# Patient Record
Sex: Female | Born: 1985 | Race: Black or African American | Hispanic: No | Marital: Married | Smoking: Former smoker
Health system: Southern US, Community
[De-identification: ages and names within clinical notes are randomized; demographics above are authoritative.]

## PROBLEM LIST (undated history)

## (undated) ENCOUNTER — Inpatient Hospital Stay (HOSPITAL_COMMUNITY): Payer: Self-pay

## (undated) DIAGNOSIS — L089 Local infection of the skin and subcutaneous tissue, unspecified: Secondary | ICD-10-CM

## (undated) HISTORY — PX: DILATION AND CURETTAGE OF UTERUS: SHX78

## (undated) HISTORY — PX: ECTOPIC PREGNANCY SURGERY: SHX613

## (undated) SURGERY — Surgical Case
Anesthesia: *Unknown

---
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
## 2012-04-03 ENCOUNTER — Ambulatory Visit (HOSPITAL_COMMUNITY)
Admission: EM | Admit: 2012-04-03 | Discharge: 2012-04-04 | Disposition: A | Discharge status: Home / Self Care | Payer: BC Managed Care – PPO | Attending: Emergency Medicine | Admitting: Emergency Medicine

## 2012-04-03 ENCOUNTER — Encounter (HOSPITAL_COMMUNITY): Payer: Self-pay

## 2012-04-03 ENCOUNTER — Emergency Department (HOSPITAL_COMMUNITY): Payer: BC Managed Care – PPO | Admitting: Anesthesiology

## 2012-04-03 ENCOUNTER — Inpatient Hospital Stay: Admit: 2012-04-03 | Payer: Self-pay | Admitting: Orthopedic Surgery

## 2012-04-03 ENCOUNTER — Encounter (HOSPITAL_COMMUNITY)
Admission: EM | Disposition: A | Discharge status: Home / Self Care | Payer: Self-pay | Source: Home / Self Care | Attending: Emergency Medicine

## 2012-04-03 ENCOUNTER — Encounter (HOSPITAL_COMMUNITY): Payer: Self-pay | Admitting: Anesthesiology

## 2012-04-03 DIAGNOSIS — M65839 Other synovitis and tenosynovitis, unspecified forearm: Secondary | ICD-10-CM | POA: Insufficient documentation

## 2012-04-03 DIAGNOSIS — L089 Local infection of the skin and subcutaneous tissue, unspecified: Secondary | ICD-10-CM

## 2012-04-03 DIAGNOSIS — L02519 Cutaneous abscess of unspecified hand: Secondary | ICD-10-CM | POA: Insufficient documentation

## 2012-04-03 DIAGNOSIS — Z792 Long term (current) use of antibiotics: Secondary | ICD-10-CM | POA: Insufficient documentation

## 2012-04-03 DIAGNOSIS — L03019 Cellulitis of unspecified finger: Secondary | ICD-10-CM | POA: Insufficient documentation

## 2012-04-03 DIAGNOSIS — M65849 Other synovitis and tenosynovitis, unspecified hand: Secondary | ICD-10-CM | POA: Insufficient documentation

## 2012-04-03 HISTORY — PX: I & D EXTREMITY: SHX5045

## 2012-04-03 HISTORY — DX: Local infection of the skin and subcutaneous tissue, unspecified: L08.9

## 2012-04-03 LAB — CBC WITH DIFFERENTIAL/PLATELET
Basophils Absolute: 0 10*3/uL (ref 0.0–0.1)
Basophils Absolute: 0 10*3/uL (ref 0.0–0.1)
Basophils Relative: 0 % (ref 0–1)
Basophils Relative: 0 % (ref 0–1)
Eosinophils Absolute: 0.1 10*3/uL (ref 0.0–0.7)
Eosinophils Relative: 1 % (ref 0–5)
HCT: 35.7 % — ABNORMAL LOW (ref 36.0–46.0)
Hemoglobin: 12.2 g/dL (ref 12.0–15.0)
Lymphocytes Relative: 20 % (ref 12–46)
Lymphs Abs: 2.4 10*3/uL (ref 0.7–4.0)
MCH: 32 pg (ref 26.0–34.0)
MCHC: 33.4 g/dL (ref 30.0–36.0)
MCHC: 34.2 g/dL (ref 30.0–36.0)
MCV: 93.7 fL (ref 78.0–100.0)
Monocytes Absolute: 0.4 10*3/uL (ref 0.1–1.0)
Monocytes Absolute: 0.4 10*3/uL (ref 0.1–1.0)
Monocytes Relative: 3 % (ref 3–12)
Neutro Abs: 10.1 10*3/uL — ABNORMAL HIGH (ref 1.7–7.7)
Neutro Abs: 8.9 10*3/uL — ABNORMAL HIGH (ref 1.7–7.7)
Neutrophils Relative %: 78 % — ABNORMAL HIGH (ref 43–77)
Neutrophils Relative %: 76 % (ref 43–77)
Platelets: 224 10*3/uL (ref 150–400)
Platelets: 216 10*3/uL (ref 150–400)
RBC: 3.81 MIL/uL — ABNORMAL LOW (ref 3.87–5.11)
RDW: 15.5 % (ref 11.5–15.5)
RDW: 15.2 % (ref 11.5–15.5)
WBC: 13 10*3/uL — ABNORMAL HIGH (ref 4.0–10.5)
WBC: 11.8 10*3/uL — ABNORMAL HIGH (ref 4.0–10.5)

## 2012-04-03 LAB — GRAM STAIN

## 2012-04-03 SURGERY — IRRIGATION AND DEBRIDEMENT EXTREMITY
Anesthesia: General | Site: Thumb | Laterality: Left | Wound class: Dirty or Infected

## 2012-04-03 MED ORDER — HYDROMORPHONE HCL PF 1 MG/ML IJ SOLN
0.5000 mg | INTRAMUSCULAR | Status: DC | PRN
  Administered 2012-04-03 – 2012-04-04 (×5): 1 mg via INTRAVENOUS
  Filled 2012-04-03 (×5): qty 1

## 2012-04-03 MED ORDER — ONDANSETRON HCL 4 MG/2ML IJ SOLN: 4.0000 mg | Freq: Once | INTRAMUSCULAR | Status: DC | PRN

## 2012-04-03 MED ORDER — ONDANSETRON HCL 4 MG/2ML IJ SOLN: 4.0000 mg | Freq: Four times a day (QID) | INTRAMUSCULAR | Status: DC | PRN

## 2012-04-03 MED ORDER — ENOXAPARIN SODIUM 30 MG/0.3ML ~~LOC~~ SOLN: 30.0000 mg | Freq: Two times a day (BID) | SUBCUTANEOUS | Status: DC

## 2012-04-03 MED ORDER — FENTANYL CITRATE 0.05 MG/ML IJ SOLN
INTRAMUSCULAR | Status: DC | PRN
  Administered 2012-04-03: 100 ug via INTRAVENOUS
  Administered 2012-04-03: 50 ug via INTRAVENOUS
  Administered 2012-04-03: 100 ug via INTRAVENOUS

## 2012-04-03 MED ORDER — PROPOFOL 10 MG/ML IV EMUL
INTRAVENOUS | Status: DC | PRN
  Administered 2012-04-03: 100 mg via INTRAVENOUS

## 2012-04-03 MED ORDER — OXYCODONE-ACETAMINOPHEN 5-325 MG PO TABS
1.0000 | ORAL_TABLET | ORAL | Status: DC | PRN
  Administered 2012-04-03 – 2012-04-04 (×3): 2 via ORAL
  Filled 2012-04-03 (×3): qty 2

## 2012-04-03 MED ORDER — VANCOMYCIN HCL IN DEXTROSE 1-5 GM/200ML-% IV SOLN
1000.0000 mg | Freq: Two times a day (BID) | INTRAVENOUS | Status: DC
  Administered 2012-04-04: 1000 mg via INTRAVENOUS
  Filled 2012-04-03 (×3): qty 200

## 2012-04-03 MED ORDER — DEXTROSE-NACL 5-0.45 % IV SOLN: INTRAVENOUS | Status: DC

## 2012-04-03 MED ORDER — ENOXAPARIN SODIUM 30 MG/0.3ML ~~LOC~~ SOLN
30.0000 mg | Freq: Two times a day (BID) | SUBCUTANEOUS | Status: DC
  Administered 2012-04-04: 30 mg via SUBCUTANEOUS
  Filled 2012-04-03 (×3): qty 0.3

## 2012-04-03 MED ORDER — BUPIVACAINE HCL (PF) 0.25 % IJ SOLN
INTRAMUSCULAR | Status: DC | PRN
  Administered 2012-04-03: 5 mL

## 2012-04-03 MED ORDER — HYDROMORPHONE HCL PF 1 MG/ML IJ SOLN: 0.5000 mg | INTRAMUSCULAR | Status: DC | PRN

## 2012-04-03 MED ORDER — METOCLOPRAMIDE HCL 10 MG PO TABS: 5.0000 mg | ORAL_TABLET | Freq: Three times a day (TID) | ORAL | Status: DC | PRN

## 2012-04-03 MED ORDER — HYDROMORPHONE HCL PF 1 MG/ML IJ SOLN
INTRAMUSCULAR | Status: AC
  Administered 2012-04-03: 0.5 mg via INTRAVENOUS
  Filled 2012-04-03: qty 1

## 2012-04-03 MED ORDER — VANCOMYCIN HCL 1000 MG IV SOLR
1000.0000 mg | INTRAVENOUS | Status: DC | PRN
  Administered 2012-04-03: 1000 mg via INTRAVENOUS

## 2012-04-03 MED ORDER — LACTATED RINGERS IV SOLN
INTRAVENOUS | Status: DC | PRN
  Administered 2012-04-03: 19:00:00 via INTRAVENOUS

## 2012-04-03 MED ORDER — SUCCINYLCHOLINE CHLORIDE 20 MG/ML IJ SOLN
INTRAMUSCULAR | Status: DC | PRN
  Administered 2012-04-03: 120 mg via INTRAVENOUS

## 2012-04-03 MED ORDER — METOCLOPRAMIDE HCL 5 MG/ML IJ SOLN: 5.0000 mg | Freq: Three times a day (TID) | INTRAMUSCULAR | Status: DC | PRN

## 2012-04-03 MED ORDER — VANCOMYCIN HCL IN DEXTROSE 1-5 GM/200ML-% IV SOLN
INTRAVENOUS | Status: AC
  Filled 2012-04-03: qty 200

## 2012-04-03 MED ORDER — DEXTROSE-NACL 5-0.45 % IV SOLN
INTRAVENOUS | Status: DC
  Administered 2012-04-04: 01:00:00 via INTRAVENOUS

## 2012-04-03 MED ORDER — MIDAZOLAM HCL 5 MG/5ML IJ SOLN
INTRAMUSCULAR | Status: DC | PRN
  Administered 2012-04-03: 2 mg via INTRAVENOUS

## 2012-04-03 MED ORDER — ONDANSETRON HCL 4 MG PO TABS: 4.0000 mg | ORAL_TABLET | Freq: Four times a day (QID) | ORAL | Status: DC | PRN

## 2012-04-03 MED ORDER — OXYCODONE-ACETAMINOPHEN 5-325 MG PO TABS: 1.0000 | ORAL_TABLET | ORAL | Status: DC | PRN

## 2012-04-03 MED ORDER — ONDANSETRON HCL 4 MG/2ML IJ SOLN
INTRAMUSCULAR | Status: DC | PRN
  Administered 2012-04-03: 4 mg via INTRAVENOUS

## 2012-04-03 MED ORDER — LIDOCAINE HCL (CARDIAC) 20 MG/ML IV SOLN
INTRAVENOUS | Status: DC | PRN
  Administered 2012-04-03: 50 mg via INTRAVENOUS

## 2012-04-03 MED ORDER — SODIUM CHLORIDE 0.9 % IR SOLN
Status: DC | PRN
  Administered 2012-04-03: 1000 mL

## 2012-04-03 MED ORDER — HYDROMORPHONE HCL PF 1 MG/ML IJ SOLN
0.2500 mg | INTRAMUSCULAR | Status: DC | PRN
  Administered 2012-04-03 (×4): 0.5 mg via INTRAVENOUS

## 2012-04-03 MED ORDER — BUPIVACAINE HCL (PF) 0.25 % IJ SOLN
INTRAMUSCULAR | Status: AC
  Filled 2012-04-03: qty 30

## 2012-04-03 SURGICAL SUPPLY — 51 items
BANDAGE CONFORM 2  STR LF (GAUZE/BANDAGES/DRESSINGS) IMPLANT
BANDAGE ELASTIC 3 VELCRO ST LF (GAUZE/BANDAGES/DRESSINGS) ×1 IMPLANT
BANDAGE ELASTIC 4 VELCRO ST LF (GAUZE/BANDAGES/DRESSINGS) ×2 IMPLANT
BANDAGE GAUZE ELAST BULKY 4 IN (GAUZE/BANDAGES/DRESSINGS) ×3 IMPLANT
BNDG CMPR 9X4 STRL LF SNTH (GAUZE/BANDAGES/DRESSINGS)
BNDG ESMARK 4X9 LF (GAUZE/BANDAGES/DRESSINGS) IMPLANT
CLOTH BEACON ORANGE TIMEOUT ST (SAFETY) ×2 IMPLANT
CORDS BIPOLAR (ELECTRODE) IMPLANT
COVER SURGICAL LIGHT HANDLE (MISCELLANEOUS) ×3 IMPLANT
CUFF TOURNIQUET SINGLE 18IN (TOURNIQUET CUFF) ×2 IMPLANT
DECANTER SPIKE VIAL GLASS SM (MISCELLANEOUS) ×1 IMPLANT
DRAIN PENROSE 1/4X12 LTX STRL (WOUND CARE) IMPLANT
DRAPE OEC MINIVIEW 54X84 (DRAPES) IMPLANT
DRAPE SURG 17X23 STRL (DRAPES) ×2 IMPLANT
DRSG PAD ABDOMINAL 8X10 ST (GAUZE/BANDAGES/DRESSINGS) ×2 IMPLANT
DURAPREP 26ML APPLICATOR (WOUND CARE) ×1 IMPLANT
ELECT REM PT RETURN 9FT ADLT (ELECTROSURGICAL) ×2
ELECTRODE REM PT RTRN 9FT ADLT (ELECTROSURGICAL) IMPLANT
GAUZE PACKING IODOFORM 1/4X5 (PACKING) ×1 IMPLANT
GAUZE XEROFORM 1X8 LF (GAUZE/BANDAGES/DRESSINGS) ×1 IMPLANT
GLOVE BIO SURGEON STRL SZ8.5 (GLOVE) ×2 IMPLANT
GOWN SRG XL XLNG 56XLVL 4 (GOWN DISPOSABLE) ×1 IMPLANT
GOWN STRL NON-REIN LRG LVL3 (GOWN DISPOSABLE) ×2 IMPLANT
GOWN STRL NON-REIN XL XLG LVL4 (GOWN DISPOSABLE) ×2
HANDPIECE INTERPULSE COAX TIP (DISPOSABLE) ×2
KIT BASIN OR (CUSTOM PROCEDURE TRAY) ×2 IMPLANT
KIT ROOM TURNOVER OR (KITS) ×2 IMPLANT
MANIFOLD NEPTUNE II (INSTRUMENTS) ×2 IMPLANT
NDL HYPO 25GX1X1/2 BEV (NEEDLE) IMPLANT
NDL HYPO 25X1 1.5 SAFETY (NEEDLE) ×1 IMPLANT
NEEDLE HYPO 25GX1X1/2 BEV (NEEDLE) IMPLANT
NEEDLE HYPO 25X1 1.5 SAFETY (NEEDLE) ×2 IMPLANT
NS IRRIG 1000ML POUR BTL (IV SOLUTION) ×2 IMPLANT
PACK ORTHO EXTREMITY (CUSTOM PROCEDURE TRAY) ×2 IMPLANT
PAD ARMBOARD 7.5X6 YLW CONV (MISCELLANEOUS) ×4 IMPLANT
PAD CAST 4YDX4 CTTN HI CHSV (CAST SUPPLIES) ×2 IMPLANT
PADDING CAST COTTON 4X4 STRL (CAST SUPPLIES) ×2
SET HNDPC FAN SPRY TIP SCT (DISPOSABLE) IMPLANT
SPONGE GAUZE 4X4 12PLY (GAUZE/BANDAGES/DRESSINGS) ×3 IMPLANT
SPONGE LAP 18X18 X RAY DECT (DISPOSABLE) ×1 IMPLANT
SUCTION FRAZIER TIP 10 FR DISP (SUCTIONS) ×2 IMPLANT
SUT VICRYL RAPIDE 4/0 PS 2 (SUTURE) IMPLANT
SYR 20CC LL (SYRINGE) IMPLANT
SYR CONTROL 10ML LL (SYRINGE) ×2 IMPLANT
TOWEL OR 17X24 6PK STRL BLUE (TOWEL DISPOSABLE) ×2 IMPLANT
TOWEL OR 17X26 10 PK STRL BLUE (TOWEL DISPOSABLE) ×2 IMPLANT
TUBE ANAEROBIC SPECIMEN COL (MISCELLANEOUS) ×2 IMPLANT
TUBE CONNECTING 12X1/4 (SUCTIONS) ×2 IMPLANT
UNDERPAD 30X30 INCONTINENT (UNDERPADS AND DIAPERS) ×2 IMPLANT
WATER STERILE IRR 1000ML POUR (IV SOLUTION) ×1 IMPLANT
YANKAUER SUCT BULB TIP NO VENT (SUCTIONS) ×2 IMPLANT

## 2012-04-03 NOTE — Preoperative (Signed)
Beta Blockers   Reason not to administer Beta Blockers:Not Applicable 

## 2012-04-03 NOTE — Anesthesia Postprocedure Evaluation (Signed)
Anesthesia Post Note  Patient: Allison Pratt  Procedure(s) Performed: Procedure(s) (LRB): IRRIGATION AND DEBRIDEMENT EXTREMITY (Left)  Anesthesia type: general  Patient location: PACU  Post pain: Pain level controlled  Post assessment: Patient's Cardiovascular Status Stable  Last Vitals:  Filed Vitals:   04/03/12 2007  BP:   Pulse: 93  Temp:   Resp: 23    Post vital signs: Reviewed and stable  Level of consciousness: sedated  Complications: No apparent anesthesia complications

## 2012-04-03 NOTE — Transfer of Care (Signed)
Immediate Anesthesia Transfer of Care Note  Patient: Allison Pratt  Procedure(s) Performed: Procedure(s) (LRB): IRRIGATION AND DEBRIDEMENT EXTREMITY (Left)  Patient Location: PACU  Anesthesia Type: General  Level of Consciousness: awake, alert  and oriented  Airway & Oxygen Therapy: Patient Spontanous Breathing and Patient connected to nasal cannula oxygen  Post-op Assessment: Report given to PACU RN and Post -op Vital signs reviewed and stable  Post vital signs: Reviewed and stable  Complications: No apparent anesthesia complications

## 2012-04-03 NOTE — ED Notes (Signed)
Pt. To have hand surgery, Dr. Rudell Cobb at the bedside.  Pt. Being prepared for surgery.

## 2012-04-03 NOTE — Progress Notes (Signed)
ANTIBIOTIC CONSULT NOTE - INITIAL  Pharmacy Consult for vancomycin Indication: left hand cellulitis  No Known Allergies  Patient Measurements: Height: 5\' 4"  (162.6 cm) Weight: 140 lb (63.504 kg) IBW/kg (Calculated) : 54.7    Vital Signs: Temp: 97.8 F (36.6 C) (08/02 2130) Temp src: Oral (08/02 1834) BP: 126/71 mmHg (08/02 2130) Pulse Rate: 68  (08/02 2115) Intake/Output from previous day:   Intake/Output from this shift: Total I/O In: 500 [I.V.:500] Out: 25 [Blood:25]  Labs:  [facility] 04/03/12 2120 04/03/12 1822  WBC 11.8* 13.0*  HGB 12.2 11.9*  PLT 216 224  LABCREA -- --  CREATININE -- --   CrCl is unknown because no creatinine reading has been taken. No results found for this basename: VANCOTROUGH:2,VANCOPEAK:2,VANCORANDOM:2,GENTTROUGH:2,GENTPEAK:2,GENTRANDOM:2,TOBRATROUGH:2,TOBRAPEAK:2,TOBRARND:2,AMIKACINPEAK:2,AMIKACINTROU:2,AMIKACIN:2, in the last 72 hours   Microbiology: Recent Results (from the past 720 hour(s))  GRAM STAIN     Status: Normal   Collection Time   04/03/12  7:37 PM      Component Value Range Status Comment   Specimen Description WOUND LEFT THUMB   Final    Special Requests PATIENT ON FOLLOWING VANCOMYCIN   Final    Gram Stain     Final    Value: ABUNDANT WBC PRESENT, PREDOMINANTLY PMN     FEW GRAM POSITIVE COCCI IN CLUSTERS   Report Status 04/03/2012 FINAL   Final     Medical History: History reviewed. No pertinent past medical history.  Medications:  Prescriptions prior to admission  Medication Sig Dispense Refill  . cephALEXin (KEFLEX) 500 MG capsule Take 500 mg by mouth 3 (three) times daily. X 7 days. Started 03/29/12.      Marland Kitchen Hydrocodone-Acetaminophen 10-300 MG TABS Take 1 tablet by mouth every 6 (six) hours as needed. For pain      . naproxen (NAPROSYN) 500 MG tablet Take 500 mg by mouth 2 (two) times daily as needed. For pain      . sulfamethoxazole-trimethoprim (BACTRIM DS) 800-160 MG per tablet Take 1 tablet by mouth 2 (two)  times daily. X 10 days. Started on 03/30/12.       Assessment: 46 YOF with no significant PMH presented with left thumb infection, s/p I&D, wound culture is pending. Pharmacy is consulted to start vancomycin empirically to cover MRSA. Vancomycin 1g was given in OR at 1925. No baseline scr available, no hx of renal impairment. Pt. Is afebrile, wbc slightly elevated, was on bactrim and keflex PTA since 7/29  Goal of Therapy:  Vancomycin trough level 10-15 mcg/ml  Plan:  - Continue vancomycin 1g IV Q12hrs as ordered by Dr. Mina Marble start at 0600 - BMET in AM, adjust vancomycin dose base on renal function if needed. - f/u cultures.   Bayard Hugger, PharmD, BCPS  Clinical Pharmacist  Pager: [phone]  04/03/2012,10:26 PM

## 2012-04-03 NOTE — Op Note (Signed)
See dictated note [id number]

## 2012-04-03 NOTE — Anesthesia Preprocedure Evaluation (Addendum)
Anesthesia Evaluation  Patient identified by MRN, date of birth, ID band Patient awake    Reviewed: Allergy & Precautions, H&P , NPO status , Patient's Chart, lab work & pertinent test results  Airway Mallampati: I TM Distance: >3 FB Neck ROM: Full    Dental  (+) Teeth Intact and Dental Advisory Given   Pulmonary neg pulmonary ROS,          Cardiovascular negative cardio ROS      Neuro/Psych negative neurological ROS  negative psych ROS   GI/Hepatic negative GI ROS, Neg liver ROS,   Endo/Other  negative endocrine ROS  Renal/GU negative Renal ROS     Musculoskeletal negative musculoskeletal ROS (+)   Abdominal   Peds  Hematology negative hematology ROS (+)   Anesthesia Other Findings   Reproductive/Obstetrics negative OB ROS                         Anesthesia Physical Anesthesia Plan  ASA: II and Emergent  Anesthesia Plan: General   Post-op Pain Management:    Induction: Intravenous, Rapid sequence and Cricoid pressure planned  Airway Management Planned: Oral ETT  Additional Equipment:   Intra-op Plan:   Post-operative Plan: Extubation in OR  Informed Consent: I have reviewed the patients History and Physical, chart, labs and discussed the procedure including the risks, benefits and alternatives for the proposed anesthesia with the patient or authorized representative who has indicated his/her understanding and acceptance.   Dental advisory given  Plan Discussed with: CRNA and Surgeon  Anesthesia Plan Comments:        Anesthesia Quick Evaluation

## 2012-04-03 NOTE — Addendum Note (Signed)
Addendum  created 04/03/12 2031 by Aubery Lapping, MD   Modules edited:Orders, PRL Based Order Sets

## 2012-04-03 NOTE — Brief Op Note (Signed)
04/03/2012  8:02 PM  PATIENT:  Allison Pratt  26 y.o. female  PRE-OPERATIVE DIAGNOSIS:  Infected Left Thumb  POST-OPERATIVE DIAGNOSIS:  * No post-op diagnosis entered *  PROCEDURE:  Procedure(s) (LRB): IRRIGATION AND DEBRIDEMENT EXTREMITY (Left)  SURGEON:  Surgeon(s) and Role:    * Marlowe Shores, MD - Primary  PHYSICIAN ASSISTANT:   ASSISTANTS: none   ANESTHESIA:   general  EBL:  Total I/O In: 500 [I.V.:500] Out: -   BLOOD ADMINISTERED:none  DRAINS: none   LOCAL MEDICATIONS USED:  MARCAINE   10cc  SPECIMEN:  Excision and Lavage/Washing  DISPOSITION OF SPECIMEN:  micro  COUNTS:  YES  TOURNIQUET:   Total Tourniquet Time Documented: Upper Arm (Left) - 22 minutes  DICTATION: .Other Dictation: Dictation Number [id number]  PLAN OF CARE: Admit for overnight observation  PATIENT DISPOSITION:  PACU - hemodynamically stable.   Delay start of Pharmacological VTE agent (>24hrs) due to surgical blood loss or risk of bleeding: not applicable

## 2012-04-03 NOTE — Addendum Note (Signed)
Addendum  created 04/03/12 2031 by Kyoko Elsea David Colon Rueth, MD   Modules edited:Orders, PRL Based Order Sets    

## 2012-04-03 NOTE — ED Provider Notes (Signed)
History     CSN: [id number]  Arrival date & time 04/03/12  1737   First MD Initiated Contact with Patient 04/03/12 1750      No chief complaint on file.   (Consider location/radiation/quality/duration/timing/severity/associated sxs/prior treatment) HPI Comments: Patient presents today from [facility] for further management of a flexor tenosynivitis of her left thumb.  She reports that the left thumb has been swollen and erythematous for the past 8 days.  Seven days ago she presented to the ED at [facility] and was started on Keflex 500mg  TID and Bactrim DS one tablet BID.  She has been taking the medication as prescribed, but feels that the swelling and pain continues to worsen.  She had an xray done one week ago, which she reports was negative.  Physician at [facility] have discussed patient with Dr. Mina Marble prior to patient's arrival in the ED and he has agreed to accept the patient.  The history is provided by the patient.    No past medical history on file.  No past surgical history on file.  No family history on file.  History  Substance Use Topics  . Smoking status: Not on file  . Smokeless tobacco: Not on file  . Alcohol Use: Not on file    OB History    No data available      Review of Systems  Constitutional: Negative for fever and chills.  Respiratory: Negative for shortness of breath.   Gastrointestinal: Negative for nausea and vomiting.  Skin: Positive for color change.  Neurological: Positive for numbness. Negative for syncope.    Allergies  Review of patient's allergies indicates no known allergies.  Home Medications   Current Outpatient Rx  Name Route Sig Dispense Refill  . CEPHALEXIN 500 MG PO CAPS Oral Take 500 mg by mouth 3 (three) times daily. X 7 days. Started 03/29/12.    Marland Kitchen HYDROCODONE-ACETAMINOPHEN 10-300 MG PO TABS Oral Take 1 tablet by mouth every 6 (six) hours as needed. For pain    . NAPROXEN 500 MG PO TABS Oral Take 500 mg by mouth 2  (two) times daily as needed. For pain    . SULFAMETHOXAZOLE-TMP DS 800-160 MG PO TABS Oral Take 1 tablet by mouth 2 (two) times daily. X 10 days. Started on 03/30/12.      There were no vitals taken for this visit.  Physical Exam  Nursing note and vitals reviewed. Constitutional: She appears well-developed and well-nourished. No distress.  HENT:  Head: Normocephalic and atraumatic.  Mouth/Throat: Oropharynx is clear and moist.  Cardiovascular: Normal rate, regular rhythm and normal heart sounds.   Pulses:      Radial pulses are 2+ on the right side, and 2+ on the left side.  Pulmonary/Chest: Effort normal and breath sounds normal.  Musculoskeletal:       Significant swelling and erythema of the left thumb  Neurological: She is alert. No sensory deficit. Gait normal.  Skin: Skin is warm and dry. She is not diaphoretic.  Psychiatric: She has a normal mood and affect.    ED Course  Procedures (including critical care time)   Labs Reviewed  CBC WITH DIFFERENTIAL   No results found.   No diagnosis found.   Dr. Mina Marble has been down to see and evaluate the patient and will take the patient to the OR.  MDM  Patient presenting with swelling and erythema of her left thumb that has been present for the past week.  She has been on Bactrim  DS and Keflex for the past week without improvement.  Dr. Mina Marble has seen patient in the ED and will bring patient to the OR for I&D.          Pascal Lux [place], PA-C 04/04/12 [phone]

## 2012-04-03 NOTE — Consult Note (Signed)
Reason for Consult:left thumb swelling Referring Physician: pickering  Allison Pratt is an 26 y.o. female.  HPI: s/p po abx foy left thumb swelling for 2-3 days with worsening of symptoms  No past medical history on file.  No past surgical history on file.  No family history on file.  Social History:  does not have a smoking history on file. She does not have any smokeless tobacco history on file. Her alcohol and drug histories not on file.  Allergies: No Known Allergies  Medications: I have reviewed the patient's current medications.  No results found for this or any previous visit (from the past 48 hour(s)).  No results found.  Review of Systems  All other systems reviewed and are negative.   There were no vitals taken for this visit. Physical Exam  Constitutional: She is oriented to person, place, and time. She appears well-developed and well-nourished.  HENT:  Head: Normocephalic and atraumatic.  Cardiovascular: Normal rate.   Respiratory: Effort normal.  Musculoskeletal:       Left hand: She exhibits tenderness and swelling.       Hands: Neurological: She is alert and oriented to person, place, and time.  Skin: Skin is warm.  Psychiatric: She has a normal mood and affect. Her speech is normal and behavior is normal. Thought content normal.    Assessment/Plan: OR fir Iand D   Allison Pratt A 04/03/2012, 6:14 PM

## 2012-04-03 NOTE — Anesthesia Procedure Notes (Signed)
Procedure Name: Intubation Date/Time: 04/03/2012 7:20 PM Performed by: Khristine Verno S Pre-anesthesia Checklist: Patient identified, Emergency Drugs available, Suction available, Patient being monitored and Timeout performed Patient Re-evaluated:Patient Re-evaluated prior to inductionOxygen Delivery Method: Circle system utilized Preoxygenation: Pre-oxygenation with 100% oxygen Intubation Type: IV induction Ventilation: Mask ventilation without difficulty Laryngoscope Size: Mac and 3 Grade View: Grade I Tube type: Oral Tube size: 7.5 mm Number of attempts: 1 Airway Equipment and Method: Stylet Placement Confirmation: ETT inserted through vocal cords under direct vision,  positive ETCO2 and breath sounds checked- equal and bilateral Secured at: 22 cm Tube secured with: Tape Dental Injury: Teeth and Oropharynx as per pre-operative assessment

## 2012-04-03 NOTE — ED Notes (Signed)
Pt. Arrived from [facility] with an infection to her lt. Palm area and thumb, area is red, swollen and warm to touch.  +CNS

## 2012-04-04 ENCOUNTER — Encounter (HOSPITAL_COMMUNITY): Payer: Self-pay | Admitting: Orthopedic Surgery

## 2012-04-04 DIAGNOSIS — L089 Local infection of the skin and subcutaneous tissue, unspecified: Secondary | ICD-10-CM

## 2012-04-04 HISTORY — DX: Local infection of the skin and subcutaneous tissue, unspecified: L08.9

## 2012-04-04 LAB — BASIC METABOLIC PANEL
BUN: 7 mg/dL (ref 6–23)
CO2: 22 mEq/L (ref 19–32)
Calcium: 9 mg/dL (ref 8.4–10.5)
Chloride: 97 mEq/L (ref 96–112)
Creatinine, Ser: 0.76 mg/dL (ref 0.50–1.10)
GFR calc Af Amer: >90 mL/min (ref >90)
GFR calc non Af Amer: >90 mL/min (ref >90)
Glucose, Bld: 115 mg/dL — ABNORMAL HIGH (ref 70–99)
Potassium: 4.1 mEq/L (ref 3.5–5.1)
Sodium: 130 mEq/L — ABNORMAL LOW (ref 135–145)

## 2012-04-04 LAB — CBC
HCT: 34.4 % — ABNORMAL LOW (ref 36.0–46.0)
Hemoglobin: 11.6 g/dL — ABNORMAL LOW (ref 12.0–15.0)
MCH: 31.4 pg (ref 26.0–34.0)
MCHC: 33.7 g/dL (ref 30.0–36.0)
MCV: 93 fL (ref 78.0–100.0)
Platelets: 237 10*3/uL (ref 150–400)
RBC: 3.7 MIL/uL — ABNORMAL LOW (ref 3.87–5.11)
RDW: 15 % (ref 11.5–15.5)
WBC: 15 10*3/uL — ABNORMAL HIGH (ref 4.0–10.5)

## 2012-04-04 MED ORDER — OXYCODONE-ACETAMINOPHEN 5-325 MG PO TABS: 1.0000 | ORAL_TABLET | ORAL | Status: AC | PRN

## 2012-04-04 MED ORDER — DOXYCYCLINE HYCLATE 100 MG PO TABS: 100.0000 mg | ORAL_TABLET | Freq: Two times a day (BID) | ORAL | Status: AC

## 2012-04-04 NOTE — Progress Notes (Signed)
Discharge instructions and prescriptions given to patient and family.  Patient instructed to follow-up at Dr. Mina Marble office Monday for whirlpool treatment.  Patient verbalized understanding.  All belongings taken by patient.   Tyeisha Dinan N

## 2012-04-04 NOTE — Discharge Summary (Addendum)
Physician Discharge Summary  Patient ID: Allison Pratt MRN: [id number] DOB/AGE: 1986-04-30 26 y.o.  Admit date: 04/03/2012 Discharge date: 04/04/2012  Admission Diagnoses:  Infection of hand  Discharge Diagnoses:  Principal Problem:  *Infection of hand   Past Medical History  Diagnosis Date  . Infection of hand 04/04/2012    Surgeries: Procedure(s): IRRIGATION AND DEBRIDEMENT EXTREMITY on 04/03/2012   Consultants (if any):    Discharged Condition: Improved  Hospital Course: Allison Pratt is an 26 y.o. female who was admitted 04/03/2012 with a diagnosis of Infection of hand and went to the operating room on 04/03/2012 and underwent the above named procedures.    She was given perioperative antibiotics:  Anti-infectives     Start     Dose/Rate Route Frequency Ordered Stop   04/04/12 0600   vancomycin (VANCOCIN) IVPB 1000 mg/200 mL premix  Status:  Discontinued     Comments: vancomycin (VANCOCIN) 1,000 mg in sodium chloride 0.9 % 250 mL IVPB (mg) 1000 mg New Bag 04/03/12 1925       1,000 mg 200 mL/hr over 60 Minutes Intravenous Every 12 hours 04/03/12 2208 04/04/12 1441   04/04/12 0000   doxycycline (VIBRA-TABS) 100 MG tablet        100 mg Oral 2 times daily 04/04/12 0940 04/14/12 2359        .  She was given sequential compression devices, early ambulation, and lovenox for DVT prophylaxis.  She benefited maximally from the hospital stay and there were no complications.    Recent vital signs:  Filed Vitals:   04/03/12 2130  BP: 126/71  Pulse:   Temp: 97.8 F (36.6 C)  Resp:     Recent laboratory studies:  Lab Results  Component Value Date   HGB 11.6* 04/04/2012   HGB 12.2 04/03/2012   HGB 11.9* 04/03/2012   Lab Results  Component Value Date   WBC 15.0* 04/04/2012   PLT 237 04/04/2012   No results found for this basename: INR   Lab Results  Component Value Date   NA 130* 04/04/2012   K 4.1 04/04/2012   CL 97 04/04/2012   CO2 22 04/04/2012   BUN 7 04/04/2012   CREATININE 0.76 04/04/2012   GLUCOSE 115* 04/04/2012    Discharge Medications:   Medication List  As of 04/04/2012  4:13 PM   STOP taking these medications         cephALEXin 500 MG capsule      Hydrocodone-Acetaminophen 10-300 MG Tabs      naproxen 500 MG tablet      sulfamethoxazole-trimethoprim 800-160 MG per tablet         TAKE these medications         doxycycline 100 MG tablet   Commonly known as: VIBRA-TABS   Take 1 tablet (100 mg total) by mouth 2 (two) times daily.      oxyCODONE-acetaminophen 5-325 MG per tablet   Commonly known as: PERCOCET/ROXICET   Take 1-2 tablets by mouth every 4 (four) hours as needed.      oxyCODONE-acetaminophen 5-325 MG per tablet   Commonly known as: PERCOCET/ROXICET   Take 1 tablet by mouth every 4 (four) hours as needed for pain.            Diagnostic Studies: No results found.  Disposition: 01-Home or Self Care  Discharge Orders    Future Orders Please Complete By Expires   Call MD / Call 911      Comments:   If you  experience chest pain or shortness of breath, CALL 911 and be transported to the hospital emergency room.  If you develope a fever above 101 F, pus (white drainage) or increased drainage or redness at the wound, or calf pain, call your surgeon's office.   Constipation Prevention      Comments:   Drink plenty of fluids.  Prune juice may be helpful.  You may use a stool softener, such as Colace (over the counter) 100 mg twice a day.  Use MiraLax (over the counter) for constipation as needed.   Increase activity slowly as tolerated      Discharge instructions      Comments:   followup in my office on Monday to have dressing change and whirlpool      Follow-up Information    Follow up with Allison Shores, MD on 04/06/2012. (to see my therapists for dressing change and whirpool)    Contact information:   [address] [place] [address] [phone]           Signed: Dairl Ponder  A 04/04/2012, 4:13 PM

## 2012-04-04 NOTE — Op Note (Signed)
NAMEORENE, ABBASI            ACCOUNT NO.:  [id number]  MEDICAL RECORD NO.:  [id number]  LOCATION:  5N20C                        FACILITY:  [facility]  PHYSICIAN:  Artist Pais. Hollace Michelli, M.D.DATE OF BIRTH:  Jan 26, 1986  DATE OF PROCEDURE: DATE OF DISCHARGE:                              OPERATIVE REPORT   PREOPERATIVE DIAGNOSIS:  Left thumb flexor sheath infection, thenar abscess.  POSTOPERATIVE DIAGNOSIS:  Left thumb flexor sheath infection, thenar abscess.  PROCEDURE:  Incision and drainage, left thumb flexor sheath infection, thenar abscess.  SURGEON:  Artist Pais. Mina Marble, M.D.  ASSISTANT:  None.  ANESTHESIA:  General.  COMPLICATIONS:  No complication.  Wound packed open.  SPECIMEN:  Cultures x2 sent.  DESCRIPTION OF PROCEDURE:  The patient was taken to the operating suite. After induction of general anesthesia, the left upper extremity was prepped and draped in sterile fashion.  An Esmarch was used to exsanguinate the limb.  Tourniquet was inflated to 250 mmHg.  At this point in time, a Brunner incision was made from the left thumb starting radially distal to ulnar proximal to IP flexion crease, then radial proximal to ulnar, distal to the MP flexion crease, then under the thenar eminence.  The opposite direction flaps were raised in a Brunner fashion.  There was significant pus seen over the flexor sheath into the thenar eminence.  It was irrigated and incised using a #15 blade and 2 L of normal saline using pulse lavage.  The infection was removed using the incision and drainage.  It was then packed open with a 1/4-inch Iodoform gauze.  Neurovascular bundle were identified and retracted.  It was loosely closed with a 1/4-inch iodoform gauze with 4-0 nylon at the corners and 1 stitch at each mid wound.  The patient was then placed in a sterile dressing, 4 x 4s, fluffs, and a compression wrap.  The patient tolerated the procedure well and went to the recovery room in  stable fashion.     Artist Pais Mina Marble, M.D.     MAW/MEDQ  D:  04/03/2012  T:  04/04/2012  Job:  [id number]

## 2012-04-04 NOTE — Progress Notes (Signed)
Subjective: 1 Day Post-Op Procedure(s) (LRB): IRRIGATION AND DEBRIDEMENT EXTREMITY (Left) Patient reports pain as moderate.    Objective: Vital signs in last 24 hours: Temp:  [97.8 F (36.6 C)-99.2 F (37.3 C)] 97.8 F (36.6 C) (08/02 2130) Pulse Rate:  [62-93] 68  (08/02 2115) Resp:  [14-28] 19  (08/02 2115) BP: (101-128)/(56-71) 126/71 mmHg (08/02 2130) SpO2:  [100 %] 100 % (08/02 2115) FiO2 (%):  [2 %] 2 % (08/02 2030) Weight:  [63.504 kg (140 lb)] 63.504 kg (140 lb) (08/02 2130)  Intake/Output from previous day: 08/02 0701 - 08/03 0700 In: 500 [I.V.:500] Out: 25 [Blood:25] Intake/Output this shift:     Basename 04/03/12 2120 04/03/12 1822  HGB 12.2 11.9*    Basename 04/03/12 2120 04/03/12 1822  WBC 11.8* 13.0*  RBC 3.81* 3.77*  HCT 35.7* 35.6*  PLT 216 224    Basename 04/04/12 0534  NA 130*  K 4.1  CL 97  CO2 22  BUN 7  CREATININE 0.76  GLUCOSE 115*  CALCIUM 9.0   No results found for this basename: LABPT:2,INR:2 in the last 72 hours  Neurologically intact  Assessment/Plan: 1 Day Post-Op Procedure(s) (LRB): IRRIGATION AND DEBRIDEMENT EXTREMITY (Left) Plan d/c to home with po doxycycline with followup in my office monday  Nevin Grizzle A 04/04/2012, 9:28 AM 2

## 2012-04-04 NOTE — ED Provider Notes (Signed)
Medical screening examination/treatment/procedure(s) were performed by non-physician practitioner and as supervising physician I was immediately available for consultation/collaboration.  Juliet Rude. Rubin Payor, MD 04/04/12 [phone]

## 2012-04-06 ENCOUNTER — Encounter (HOSPITAL_COMMUNITY): Payer: Self-pay | Admitting: Orthopedic Surgery

## 2012-04-06 LAB — WOUND CULTURE

## 2012-04-06 MED FILL — Cefazolin Sodium for IV Soln 1 GM and Dextrose 4% (50 ML): INTRAVENOUS | Qty: 50 | Status: AC

## 2012-04-08 LAB — ANAEROBIC CULTURE

## 2013-05-19 ENCOUNTER — Emergency Department (HOSPITAL_COMMUNITY)
Admission: EM | Admit: 2013-05-19 | Discharge: 2013-05-19 | Disposition: A | Discharge status: Home / Self Care | Payer: BC Managed Care – PPO | Attending: Emergency Medicine | Admitting: Emergency Medicine

## 2013-05-19 ENCOUNTER — Encounter (HOSPITAL_COMMUNITY): Payer: Self-pay | Admitting: *Deleted

## 2013-05-19 DIAGNOSIS — F17200 Nicotine dependence, unspecified, uncomplicated: Secondary | ICD-10-CM | POA: Insufficient documentation

## 2013-05-19 DIAGNOSIS — K0889 Other specified disorders of teeth and supporting structures: Secondary | ICD-10-CM

## 2013-05-19 DIAGNOSIS — Z872 Personal history of diseases of the skin and subcutaneous tissue: Secondary | ICD-10-CM | POA: Insufficient documentation

## 2013-05-19 DIAGNOSIS — K089 Disorder of teeth and supporting structures, unspecified: Secondary | ICD-10-CM | POA: Insufficient documentation

## 2013-05-19 MED ORDER — OXYCODONE-ACETAMINOPHEN 5-325 MG PO TABS: 2.0000 | ORAL_TABLET | ORAL | Status: DC | PRN

## 2013-05-19 MED ORDER — PENICILLIN V POTASSIUM 500 MG PO TABS: 500.0000 mg | ORAL_TABLET | Freq: Three times a day (TID) | ORAL | Status: DC

## 2013-05-19 NOTE — ED Provider Notes (Signed)
CSN: [id number]     Arrival date & time 05/19/13  0507 History   First MD Initiated Contact with Patient 05/19/13 0522     Chief Complaint  Patient presents with  . Dental Pain   (Consider location/radiation/quality/duration/timing/severity/associated sxs/prior Treatment) Patient is a 27 y.o. female presenting with tooth pain. The history is provided by the patient.  Dental Pain Location:  Lower Lower teeth location:  32/RL 3rd molar Quality:  Throbbing Severity:  Severe Onset quality:  Gradual Duration:  2 weeks Timing:  Constant Progression:  Worsening Chronicity:  New Context: not abscess     Past Medical History  Diagnosis Date  . Infection of hand 04/04/2012   Past Surgical History  Procedure Laterality Date  . I&d extremity  04/03/2012    Procedure: IRRIGATION AND DEBRIDEMENT EXTREMITY;  Surgeon: Marlowe Shores, MD;  Location: [facility] OR;  Service: Orthopedics;  Laterality: Left;   History reviewed. No pertinent family history. History  Substance Use Topics  . Smoking status: Current Some Day Smoker -- 0.25 packs/day  . Smokeless tobacco: Never Used  . Alcohol Use: No   OB History   Grav Para Term Preterm Abortions TAB SAB Ect Mult Living                 Review of Systems  All other systems reviewed and are negative.    Allergies  Review of patient's allergies indicates no known allergies.  Home Medications  No current outpatient prescriptions on file. BP 175/77  Pulse 68  Temp(Src) 98.8 F (37.1 C) (Oral)  Resp 20  Ht 5\' 3"  (1.6 m)  Wt 152 lb (68.947 kg)  BMI 26.93 kg/m2  LMP 05/02/2013 Physical Exam  Nursing note and vitals reviewed. Constitutional: She is oriented to person, place, and time. She appears well-developed and well-nourished. No distress.  HENT:  Head: Normocephalic and atraumatic.  Mouth/Throat: Oropharynx is clear and moist.  The right lower third molar is noted to have significant decay. It is tender to palpation. There is  surrounding gingival inflammation but no obvious abscess formation.  Neck: Normal range of motion. Neck supple.  Musculoskeletal: Normal range of motion. She exhibits no edema.  Lymphadenopathy:    She has no cervical adenopathy.  Neurological: She is alert and oriented to person, place, and time.  Skin: Skin is warm and dry. She is not diaphoretic.    ED Course  Procedures (including critical care time) Labs Review Labs Reviewed - No data to display Imaging Review No results found.  MDM  No diagnosis found. We'll treat with antibiotics, pain medication, and followup with dentistry in the upcoming days. I see no abscess that requires drainage and there is no facial swelling.    Geoffery Lyons, MD 05/19/13 [phone]

## 2013-05-19 NOTE — ED Notes (Signed)
Pt c/o pain to a tooth on the lower left side of her mouth.

## 2013-05-19 NOTE — Discharge Instructions (Signed)
Dental Pain A tooth ache may be caused by cavities (tooth decay). Cavities expose the nerve of the tooth to air and hot or cold temperatures. It may come from an infection or abscess (also called a boil or furuncle) around your tooth. It is also often caused by dental caries (tooth decay). This causes the pain you are having. DIAGNOSIS  Your caregiver can diagnose this problem by exam. TREATMENT   If caused by an infection, it may be treated with medications which kill germs (antibiotics) and pain medications as prescribed by your caregiver. Take medications as directed.  Only take over-the-counter or prescription medicines for pain, discomfort, or fever as directed by your caregiver.  Whether the tooth ache today is caused by infection or dental disease, you should see your dentist as soon as possible for further care. SEEK MEDICAL CARE IF: The exam and treatment you received today has been provided on an emergency basis only. This is not a substitute for complete medical or dental care. If your problem worsens or new problems (symptoms) appear, and you are unable to meet with your dentist, call or return to this location. SEEK IMMEDIATE MEDICAL CARE IF:   You have a fever.  You develop redness and swelling of your face, jaw, or neck.  You are unable to open your mouth.  You have severe pain uncontrolled by pain medicine. MAKE SURE YOU:   Understand these instructions.  Will watch your condition.  Will get help right away if you are not doing well or get worse. Document Released: 08/19/2005 Document Revised: 11/11/2011 Document Reviewed: 04/06/2008 [facility] Patient Information 2014 [place], [place].  RESOURCE GUIDE  Chronic Pain Problems: Contact [facility]  [phone] Patients need to be referred by their primary care doctor.  Insufficient Money for Medicine: Contact United Way:  call [phone]  No Primary Care Doctor: - Call Health Connect  [phone]  - can help you locate a primary care doctor that  accepts your insurance, provides certain services, etc. - Physician Referral Service- [phone]  Agencies that provide inexpensive medical care: - [facility]  [phone] - [facility]  [phone] - [facility]  [phone] - Women's Clinic  [phone] - Planned Parenthood  [phone] [facility]  [phone]  Medicaid-accepting [facility] Providers: - [facility]- [address]  [phone], Mon-Fri 9am-7pm, Sat 9am-1pm - [facility]- [address] [place], [address] [place]  [phone] - [facility]- [address], Suite [place]  [phone] [facility]- [address]  [phone] - Renaye Rakers- [address] [place], [address], [phone]  Only accepts [place] patients after they have their name  applied to their card  Self Pay (no insurance) in [place]: - Sickle Cell Patients - [facility]  [address] [place], [phone] - [facility]- [address] [place]  [phone]       [facility] [place] [facility]- [address]       -     [facility]- see information above (Speak to [employer] if you do not have insurance)       -  [facility]- [address],  [phone]       -  [facility]- [address] [phone]       -  Dr Julio Sicks-  [address], [place], [phone]       -  [facility] - [address], [phone]       -  [facility]- [address], [phone], also [address] [phone]       -     [facility]- [address] [place], [phone], 1st & 3rd Saturday         every month, 10am-1pm  -     [facility]   [address] [place], [place].   Phone:  [phone], Fax:  [phone]. Hours of  Operation:  9 am - 6 pm, M-F.  -     [facility]   [address], [place]   Phone: [phone], Fax: [phone]. Hours of Operation:  8:30 am - 5:30 pm, M-F.  [facility] [address] [place], [place] [address] [phone]  [facility] [address] [place], [place] [address] [phone]  1) Find a Doctor and Pay Out of Pocket Although you won't have to find out who is covered by your insurance plan, it is a good idea to ask around and get recommendations. You will then need to call the office and see if the doctor you have chosen will accept you as a new patient and what types of options they offer for patients who are self-pay. Some doctors offer discounts or will set up payment plans for their patients who do not have insurance, but you will need to ask so you aren't surprised when you get to your appointment.  2) Contact Your Local Health Department Not all health departments have doctors that can see patients for sick visits, but many do, so it is worth a call to see if yours does. If you don't know where your local health department is, you can check in your phone book. The CDC also has a tool to help you locate your state's health department, and many state websites also have listings of all of their local health departments.  3) Find a Walk-in Clinic If your illness is not likely to be very severe or complicated, you may want to try a walk in clinic. These are popping up all over the country in pharmacies, drugstores, and shopping centers. They're usually staffed by nurse practitioners or physician assistants that have been trained to treat common illnesses and complaints. They're usually fairly quick and inexpensive. However, if you have serious medical issues or chronic medical problems, these are probably not your best option  STD Testing - [facility] Department of [facility], STD  Clinic, [address] [place], phone [phone] or [phone].  Monday - Friday, call for an appointment. [facility] Department of [employer], STD Clinic, [address], [place], phone [phone] or [phone].  Monday - Friday, call for an appointment.  Abuse/Neglect: [facility] Child Abuse Hotline 313-537-8853 [facility] Child Abuse Hotline 302-489-6360 (After Hours)  Emergency Shelter:  [facility] 847-802-7914  Maternity Homes: - [facility] at [facility] [place] [facility] [phone] - [facility] [phone]  MRSA Hotline #:   281-433-2962  Dental Assistance If unable to pay or uninsured, contact:  [facility]. to become qualified for the adult dental clinic.  Patients with Medicaid: [facility]  [address], [phone] [address], [phone]  If unable to pay, or uninsured, contact [facility] [phone] in [place], [phone] in [facility]) to become qualified for the adult dental clinic  [facility] [address] [place], [place] [address] [phone] [email or web address]  Other Proofreader Services: - [facility]- [address] [place], [place], [place], [address], [phone], Ext. [phone], 2nd and 4th Thursday of the month at 6:30am.  10 clients each day by appointment, can sometimes see walk-in patients if someone does not show for an appointment. [facility]- [address] [place], [place], [address], [phone] - [facility] [address], [place], [place], [address], [phone] - [facility]- [phone] [facility] Health Department- [phone] [phone]       Behavioral Health Resources in the [facility]  Intensive Outpatient Programs: [facility]      [address] [place], [place] [phone] Both a day and evening program       [facility] Outpatient     [address]        [place], [place] [address] [phone]         ADS: [facility] [address] [place] [phone]  [facility] Mental Health ACCESS LINE: [phone] or [phone] [address] [place], [place] [address] [email or web address]   Substance Abuse Resources: - [facility]  [phone] - [facility] [phone] - [facility] [place] [phone] Floydene Flock [phone] - Residential & Outpatient Substance Abuse Program  [phone]  Psychological Services: [facility]  [phone] [facility]  [phone] - [facility], [phone] [place]. [address], [place], ACCESS LINE: (825) [phone] or [phone], [email or web address]  Mobile Crisis Teams:                                        [facility]         Mobile Crisis Care Unit [phone]             [facility] [address] Ginette Otto [phone]                                         Interventionist [address] [place] [address] [place] [phone]  Self-Help/Support Groups: Mental Health Assoc. of [employer] of support groups [phone] (call for more info)  Narcotics Anonymous (NA) Caring Services [address] [place] - 2 meetings at this location  Residential Treatment Programs:  [facility]      [address]        [place]       [phone]         [facility] [address], [place] [address] [place], [place]  [address] [phone]  [facility]  [address] [place], [place] [address] [phone] Admissions: 8am-3pm M-F  [facility]     [address]          [place], [place] [address]       [phone]         [facility] [address] [place] [address]  Leonard Schwartz [place], [place] [phone]  [facility] [address] [place], [place] [phone]  [facility] - Intensive Outpatient      [address]     [place], [place]       [phone]         [facility] ([facility]iction [facility]     [address] [place], [place] [phone] or [phone]  Residential Treatment Services (RTS), Medicaid [address] [place], [place] [phone]  Fellowship [address]                                               [address] [employer] [place] [phone]  [facility] Resources: [phone]               General Therapy                                                Angie Fava, PhD        [address] [place], [place] [address]         [phone]   Insurance  [facility]   [address] [place], [place] [address] [phone]  [facility] [address] [place], [place] [address] [phone] Insurance/Medicaid/sponsorship through [facility] and Families                                              [address]                                        [place], [place] [address]    Therapy/tele-psych/case         [phone]          [facility] [address], [place]  [address]  Adolescent/group home/case management [phone]                                           Creola Corn PhD       General therapy       Insurance   [phone]         Dr. Lolly Mustache, [place], M-F [phone]  [facility]  [employer] [facility] Dept. [address]                 [address]         [address] [place] [address]  [address]  Cristobal Goldmann Phone:  [phone]                                   Phone:  [phone]                   Phone:  [phone]  [facility], [phone] - [facility] - [facility]- [phone]       -     [facility] in [place], [address],             [phone], [facility] Child Abuse Hotline [phone]) [phone] or [phone] (After Hours)

## 2013-05-28 ENCOUNTER — Inpatient Hospital Stay (HOSPITAL_COMMUNITY): Payer: BC Managed Care – PPO

## 2013-05-28 ENCOUNTER — Inpatient Hospital Stay (HOSPITAL_COMMUNITY)
Admission: AD | Admit: 2013-05-28 | Discharge: 2013-05-28 | Disposition: A | Discharge status: Home / Self Care | Payer: BC Managed Care – PPO | Source: Ambulatory Visit | Attending: Obstetrics & Gynecology | Admitting: Obstetrics & Gynecology

## 2013-05-28 ENCOUNTER — Encounter (HOSPITAL_COMMUNITY): Payer: Self-pay | Admitting: *Deleted

## 2013-05-28 DIAGNOSIS — A499 Bacterial infection, unspecified: Secondary | ICD-10-CM | POA: Insufficient documentation

## 2013-05-28 DIAGNOSIS — O26899 Other specified pregnancy related conditions, unspecified trimester: Secondary | ICD-10-CM

## 2013-05-28 DIAGNOSIS — N83209 Unspecified ovarian cyst, unspecified side: Secondary | ICD-10-CM

## 2013-05-28 DIAGNOSIS — B9689 Other specified bacterial agents as the cause of diseases classified elsewhere: Secondary | ICD-10-CM | POA: Insufficient documentation

## 2013-05-28 DIAGNOSIS — N760 Acute vaginitis: Secondary | ICD-10-CM | POA: Insufficient documentation

## 2013-05-28 DIAGNOSIS — O9989 Other specified diseases and conditions complicating pregnancy, childbirth and the puerperium: Secondary | ICD-10-CM

## 2013-05-28 DIAGNOSIS — O210 Mild hyperemesis gravidarum: Secondary | ICD-10-CM | POA: Insufficient documentation

## 2013-05-28 DIAGNOSIS — O34599 Maternal care for other abnormalities of gravid uterus, unspecified trimester: Secondary | ICD-10-CM | POA: Insufficient documentation

## 2013-05-28 DIAGNOSIS — R109 Unspecified abdominal pain: Secondary | ICD-10-CM | POA: Insufficient documentation

## 2013-05-28 DIAGNOSIS — O2390 Unspecified genitourinary tract infection in pregnancy, unspecified trimester: Secondary | ICD-10-CM | POA: Insufficient documentation

## 2013-05-28 LAB — HCG, QUANTITATIVE, PREGNANCY: hCG, Beta Chain, Quant, S: 3593 m[IU]/mL — ABNORMAL HIGH (ref <5)

## 2013-05-28 LAB — POCT PREGNANCY, URINE: Preg Test, Ur: POSITIVE — AB

## 2013-05-28 LAB — CBC
HCT: 39.1 % (ref 36.0–46.0)
Hemoglobin: 13.4 g/dL (ref 12.0–15.0)
MCH: 31.7 pg (ref 26.0–34.0)
MCHC: 34.3 g/dL (ref 30.0–36.0)
MCV: 92.4 fL (ref 78.0–100.0)

## 2013-05-28 LAB — URINALYSIS, ROUTINE W REFLEX MICROSCOPIC
Bilirubin Urine: NEGATIVE
Glucose, UA: NEGATIVE mg/dL
Hgb urine dipstick: NEGATIVE
Ketones, ur: NEGATIVE mg/dL
Protein, ur: NEGATIVE mg/dL
Urobilinogen, UA: 0.2 mg/dL (ref 0.0–1.0)

## 2013-05-28 LAB — WET PREP, GENITAL

## 2013-05-28 LAB — URINE MICROSCOPIC-ADD ON

## 2013-05-28 MED ORDER — METRONIDAZOLE 500 MG PO TABS: 500.0000 mg | ORAL_TABLET | Freq: Two times a day (BID) | ORAL | Status: DC

## 2013-05-28 MED FILL — Oxycodone w/ Acetaminophen Tab 5-325 MG: ORAL | Qty: 6 | Status: AC

## 2013-05-28 NOTE — MAU Note (Signed)
Patient presents to MAU with c/o of lower abdominal cramping since yesterday that has progressively gotten worse. Denies bleeding or discharge at this time.

## 2013-05-28 NOTE — MAU Provider Note (Signed)
History     CSN: [id number]  Arrival date and time: 05/28/13 0941   First Provider Initiated Contact with Patient 05/28/13 1019      Chief Complaint  Patient presents with  . Abdominal Pain   HPI Ms. Allison Pratt is a 27 y.o. G1P0 at [redacted]w[redacted]d who presents to MAU today with complaint of abdominal pain x 1 week that became worse last night. The patient is also having nausea without vomiting or constipation. She has had a few loose stools x 2 days. She states that pain is constant and cramping in nature, but she has not taken anything for pain. LMP 04/22/13. She denies vaginal discharge, bleeding, UTI symptoms or fever. She is currently on Pen VK for a tooth abscess.    OB History   Grav Para Term Preterm Abortions TAB SAB Ect Mult Living   1               Past Medical History  Diagnosis Date  . Infection of hand 04/04/2012    Past Surgical History  Procedure Laterality Date  . I&d extremity  04/03/2012    Procedure: IRRIGATION AND DEBRIDEMENT EXTREMITY;  Surgeon: Marlowe Shores, MD;  Location: [facility];  Service: Orthopedics;  Laterality: Left;    History reviewed. No pertinent family history.  History  Substance Use Topics  . Smoking status: Current Some Day Smoker -- 0.25 packs/day  . Smokeless tobacco: Never Used  . Alcohol Use: No    Allergies: No Known Allergies  Prescriptions prior to admission  Medication Sig Dispense Refill  . oxyCODONE-acetaminophen (PERCOCET) 5-325 MG per tablet Take 2 tablets by mouth every 4 (four) hours as needed for pain.  15 tablet  0  . penicillin v potassium (VEETID) 500 MG tablet Take 1 tablet (500 mg total) by mouth 3 (three) times daily.  30 tablet  0    Review of Systems  Constitutional: Negative for fever and malaise/fatigue.  Gastrointestinal: Positive for nausea, abdominal pain and diarrhea. Negative for vomiting and constipation.  Genitourinary: Negative for dysuria, urgency and frequency.       Neg - vaginal bleeding,  discharge  Neurological: Negative for dizziness, loss of consciousness and weakness.   Physical Exam   Blood pressure 107/54, pulse 67, temperature 97.7 F (36.5 C), temperature source Oral, resp. rate 20, height 5\' 3"  (1.6 m), weight 151 lb (68.493 kg), last menstrual period 04/22/2013, SpO2 100.00%.  Physical Exam  Constitutional: She is oriented to person, place, and time. She appears well-developed and well-nourished. No distress.  HENT:  Head: Normocephalic and atraumatic.  Cardiovascular: Normal rate, regular rhythm and normal heart sounds.   Respiratory: Effort normal and breath sounds normal. No respiratory distress.  GI: Soft. Bowel sounds are normal. She exhibits no distension and no mass. There is tenderness (mild tenderness to palpation of the lower abdomen). There is no rebound and no guarding.  Genitourinary: Uterus is not enlarged and not tender. Cervix exhibits no motion tenderness, no discharge and no friability. Right adnexum displays no mass and no tenderness. Left adnexum displays no mass and no tenderness. No bleeding around the vagina. Vaginal discharge (small amount of thin, white discharge noted in the vagina) found.  Neurological: She is alert and oriented to person, place, and time.  Skin: Skin is warm and dry. No erythema.  Psychiatric: She has a normal mood and affect.   Results for orders placed during the hospital encounter of 05/28/13 (from the past 24 hour(s))  URINALYSIS, ROUTINE  W REFLEX MICROSCOPIC     Status: Abnormal   Collection Time    05/28/13  9:50 AM      Result Value Range   Color, Urine YELLOW  YELLOW   APPearance CLEAR  CLEAR   Specific Gravity, Urine >1.030 (*) 1.005 - 1.030   pH 6.0  5.0 - 8.0   Glucose, UA NEGATIVE  NEGATIVE mg/dL   Hgb urine dipstick NEGATIVE  NEGATIVE   Bilirubin Urine NEGATIVE  NEGATIVE   Ketones, ur NEGATIVE  NEGATIVE mg/dL   Protein, ur NEGATIVE  NEGATIVE mg/dL   Urobilinogen, UA 0.2  0.0 - 1.0 mg/dL   Nitrite  NEGATIVE  NEGATIVE   Leukocytes, UA MODERATE (*) NEGATIVE  URINE MICROSCOPIC-ADD ON     Status: Abnormal   Collection Time    05/28/13  9:50 AM      Result Value Range   Squamous Epithelial / LPF RARE  RARE   WBC, UA 21-50  <3 WBC/hpf   Bacteria, UA FEW (*) RARE  POCT PREGNANCY, URINE     Status: Abnormal   Collection Time    05/28/13 10:21 AM      Result Value Range   Preg Test, Ur POSITIVE (*) NEGATIVE  WET PREP, GENITAL     Status: Abnormal   Collection Time    05/28/13 10:30 AM      Result Value Range   Yeast Wet Prep HPF POC NONE SEEN  NONE SEEN   Trich, Wet Prep NONE SEEN  NONE SEEN   Clue Cells Wet Prep HPF POC FEW (*) NONE SEEN   WBC, Wet Prep HPF POC MODERATE (*) NONE SEEN  CBC     Status: None   Collection Time    05/28/13 10:51 AM      Result Value Range   WBC 7.1  4.0 - 10.5 K/uL   RBC 4.23  3.87 - 5.11 MIL/uL   Hemoglobin 13.4  12.0 - 15.0 g/dL   HCT 21.3  08.6 - 57.8 %   MCV 92.4  78.0 - 100.0 fL   MCH 31.7  26.0 - 34.0 pg   MCHC 34.3  30.0 - 36.0 g/dL   RDW 46.9  62.9 - 52.8 %   Platelets 256  150 - 400 K/uL  ABO/RH     Status: None   Collection Time    05/28/13 10:51 AM      Result Value Range   ABO/RH(D) O POS    HCG, QUANTITATIVE, PREGNANCY     Status: Abnormal   Collection Time    05/28/13 10:51 AM      Result Value Range   hCG, Beta Chain, Quant, S 3593 (*) <5 mIU/mL    US Ob Comp Less 14 Wks  05/28/2013   CLINICAL DATA:  Lower abdominal pain.  Unsure of LMP.  EXAM: OBSTETRIC <14 WK [place] AND TRANSVAGINAL OB US  TECHNIQUE: Both transabdominal and transvaginal ultrasound examinations were performed for complete evaluation of the gestation as well as the maternal uterus, adnexal regions, and pelvic cul-de-sac. Transvaginal technique was performed to assess early pregnancy.  COMPARISON:  None.  FINDINGS: Intrauterine gestational sac: Visualized/normal in shape.  Yolk sac:  Visualized  Embryo:  Not visualized  MSD:  6  mm   5 w   1  d  Maternal  uterus/adnexae: Left ovary is normal in appearance. The right ovary contains a cystic lesion with a few internal septations and a mural nodular density which shows no definite evidence  of internal blood flow on color Doppler ultrasound. This measures 4.6 x 4.1 x 4.8 cm, and no definite malignant characteristics are seen. A benign cystic ovarian neoplasm cannot be excluded.  IMPRESSION: Single intrauterine gestational sac measuring 5 weeks 1 day by mean sac diameter.  4.8 cm complex cystic lesion in left ovary. Benign cystic ovarian neoplasm cannot be excluded. Recommend continued followup by ultrasound in 6 weeks.   Electronically Signed   By: Myles Rosenthal   On: 05/28/2013 12:06   US Ob Transvaginal  05/28/2013   CLINICAL DATA:  Lower abdominal pain.  Unsure of LMP.  EXAM: OBSTETRIC <14 WK [place] AND TRANSVAGINAL OB US  TECHNIQUE: Both transabdominal and transvaginal ultrasound examinations were performed for complete evaluation of the gestation as well as the maternal uterus, adnexal regions, and pelvic cul-de-sac. Transvaginal technique was performed to assess early pregnancy.  COMPARISON:  None.  FINDINGS: Intrauterine gestational sac: Visualized/normal in shape.  Yolk sac:  Visualized  Embryo:  Not visualized  MSD:  6  mm   5 w   1  d  Maternal uterus/adnexae: Left ovary is normal in appearance. The right ovary contains a cystic lesion with a few internal septations and a mural nodular density which shows no definite evidence of internal blood flow on color Doppler ultrasound. This measures 4.6 x 4.1 x 4.8 cm, and no definite malignant characteristics are seen. A benign cystic ovarian neoplasm cannot be excluded.  IMPRESSION: Single intrauterine gestational sac measuring 5 weeks 1 day by mean sac diameter.  4.8 cm complex cystic lesion in left ovary. Benign cystic ovarian neoplasm cannot be excluded. Recommend continued followup by ultrasound in 6 weeks.   Electronically Signed   By: Myles Rosenthal   On: 05/28/2013  12:06    MAU Course  Procedures None  MDM +UPT UA, Wet prep, GC/Chlamydia, CBC, ABO/Rh, quant hCG and [place] today  Assessment and Plan  A: IUGS and YS at [redacted]w[redacted]d Ovarian cyst Bacterial vaginosis  P: Discharge home Rx for Flagyl sent to patient's pharmacy [facility] for [facility] care Pregnancy confirmation letter given with Medicaid assistance information if necessary First trimester warning signs reviewed Patient may return to MAU as needed or if her condition were to change or worsen  Freddi Starr, PA-C  05/28/2013, 10:19 AM

## 2013-05-28 NOTE — MAU Provider Note (Signed)
Attestation of Attending Supervision of Advanced Practitioner (CNM/NP): Evaluation and management procedures were performed by the Advanced Practitioner under my supervision and collaboration.  I have reviewed the Advanced Practitioner's note and chart, and I agree with the management and plan.  HARRAWAY-SMITH, Tiwan Schnitker 3:27 PM

## 2013-05-29 LAB — URINE CULTURE: Colony Count: 65000

## 2013-05-29 LAB — GC/CHLAMYDIA PROBE AMP: CT Probe RNA: NEGATIVE

## 2013-06-30 ENCOUNTER — Encounter: Payer: BC Managed Care – PPO | Admitting: Obstetrics and Gynecology

## 2014-04-02 ENCOUNTER — Encounter (HOSPITAL_COMMUNITY): Payer: Self-pay | Admitting: *Deleted

## 2014-07-04 ENCOUNTER — Encounter (HOSPITAL_COMMUNITY): Payer: Self-pay | Admitting: *Deleted

## 2019-01-29 ENCOUNTER — Ambulatory Visit (HOSPITAL_COMMUNITY)
Admission: RE | Admit: 2019-01-29 | Discharge: 2019-01-29 | Disposition: A | Discharge status: Home / Self Care | Payer: BC Managed Care – PPO | Source: Ambulatory Visit | Attending: Family Medicine | Admitting: Family Medicine

## 2019-01-29 ENCOUNTER — Other Ambulatory Visit (HOSPITAL_COMMUNITY): Payer: Self-pay | Admitting: Family Medicine

## 2019-01-29 ENCOUNTER — Encounter (HOSPITAL_COMMUNITY): Payer: Self-pay

## 2019-01-29 ENCOUNTER — Other Ambulatory Visit: Payer: Self-pay

## 2019-01-29 DIAGNOSIS — M5440 Lumbago with sciatica, unspecified side: Secondary | ICD-10-CM

## 2019-01-29 DIAGNOSIS — M542 Cervicalgia: Secondary | ICD-10-CM

## 2019-09-24 ENCOUNTER — Ambulatory Visit: Payer: BC Managed Care – PPO | Attending: Family

## 2019-09-24 ENCOUNTER — Other Ambulatory Visit: Payer: Self-pay

## 2019-09-24 DIAGNOSIS — Z20822 Contact with and (suspected) exposure to covid-19: Secondary | ICD-10-CM

## 2019-09-25 LAB — NOVEL CORONAVIRUS, NAA: SARS-CoV-2, NAA: NOT DETECTED

## 2019-10-13 ENCOUNTER — Encounter: Payer: BC Managed Care – PPO | Admitting: Adult Health

## 2019-10-14 ENCOUNTER — Other Ambulatory Visit: Payer: Self-pay

## 2019-10-14 ENCOUNTER — Inpatient Hospital Stay (HOSPITAL_COMMUNITY): Payer: BC Managed Care – PPO

## 2019-10-14 ENCOUNTER — Inpatient Hospital Stay (HOSPITAL_COMMUNITY)
Admission: AD | Admit: 2019-10-14 | Discharge: 2019-10-14 | Disposition: A | Discharge status: Home / Self Care | Payer: BC Managed Care – PPO | Attending: Obstetrics and Gynecology | Admitting: Obstetrics and Gynecology

## 2019-10-14 ENCOUNTER — Encounter (HOSPITAL_COMMUNITY): Payer: Self-pay | Admitting: Obstetrics and Gynecology

## 2019-10-14 DIAGNOSIS — O4690 Antepartum hemorrhage, unspecified, unspecified trimester: Secondary | ICD-10-CM

## 2019-10-14 DIAGNOSIS — O4691 Antepartum hemorrhage, unspecified, first trimester: Secondary | ICD-10-CM | POA: Diagnosis not present

## 2019-10-14 DIAGNOSIS — O209 Hemorrhage in early pregnancy, unspecified: Secondary | ICD-10-CM | POA: Diagnosis present

## 2019-10-14 DIAGNOSIS — O3680X0 Pregnancy with inconclusive fetal viability, not applicable or unspecified: Secondary | ICD-10-CM | POA: Insufficient documentation

## 2019-10-14 DIAGNOSIS — Z3A01 Less than 8 weeks gestation of pregnancy: Secondary | ICD-10-CM

## 2019-10-14 LAB — URINALYSIS, ROUTINE W REFLEX MICROSCOPIC
Bacteria, UA: NONE SEEN
Bilirubin Urine: NEGATIVE
Glucose, UA: NEGATIVE mg/dL
Ketones, ur: NEGATIVE mg/dL
Nitrite: NEGATIVE
Protein, ur: NEGATIVE mg/dL
Specific Gravity, Urine: 1.023 (ref 1.005–1.030)
pH: 6 (ref 5.0–8.0)

## 2019-10-14 LAB — CBC
HCT: 38.2 % (ref 36.0–46.0)
Hemoglobin: 12.1 g/dL (ref 12.0–15.0)
MCH: 30.3 pg (ref 26.0–34.0)
MCHC: 31.7 g/dL (ref 30.0–36.0)
MCV: 95.5 fL (ref 80.0–100.0)
Platelets: 268 10*3/uL (ref 150–400)
RBC: 4 MIL/uL (ref 3.87–5.11)
RDW: 16.6 % — ABNORMAL HIGH (ref 11.5–15.5)
WBC: 7.7 10*3/uL (ref 4.0–10.5)
nRBC: 0 % (ref 0.0–0.2)

## 2019-10-14 LAB — HCG, QUANTITATIVE, PREGNANCY: hCG, Beta Chain, Quant, S: 66 m[IU]/mL — ABNORMAL HIGH (ref <5)

## 2019-10-14 LAB — POCT PREGNANCY, URINE: Preg Test, Ur: POSITIVE — AB

## 2019-10-14 NOTE — Discharge Instructions (Signed)
Miscarriage A miscarriage is the loss of an unborn baby (fetus) before the 20th week of pregnancy. Follow these instructions at home: Medicines   Take over-the-counter and prescription medicines only as told by your doctor.  If you were prescribed antibiotic medicine, take it as told by your doctor. Do not stop taking the antibiotic even if you start to feel better.  Do not take NSAIDs unless your doctor says that this is safe for you. NSAIDs include aspirin and ibuprofen. These medicines can cause bleeding. Activity  Rest as directed. Ask your doctor what activities are safe for you.  Have someone help you at home during this time. General instructions  Write down how many pads you use each day and how soaked they are.  Watch the amount of tissue or clumps of blood (blood clots) that you pass from your vagina. Save any large amounts of tissue for your doctor.  Do not use tampons, douche, or have sex until your doctor approves.  To help you and your partner with the process of grieving, talk with your doctor or seek counseling.  When you are ready, meet with your doctor to talk about steps you should take for your health. Also, talk with your doctor about steps to take to have a healthy pregnancy in the future.  Keep all follow-up visits as told by your doctor. This is important. Contact a doctor if:  You have a fever or chills.  You have vaginal discharge that smells bad.  You have more bleeding. Get help right away if:  You have very bad cramps or pain in your back or belly.  You pass clumps of blood that are walnut-sized or larger from your vagina.  You pass tissue that is walnut-sized or larger from your vagina.  You soak more than 1 regular pad in an hour.  You get light-headed or weak.  You faint (pass out).  You have feelings of sadness that do not go away, or you have thoughts of hurting yourself. Summary  A miscarriage is the loss of an unborn baby before  the 20th week of pregnancy.  Follow your doctor's instructions for home care. Keep all follow-up appointments.  To help you and your partner with the process of grieving, talk with your doctor or seek counseling. This information is not intended to replace advice given to you by your health care provider. Make sure you discuss any questions you have with your health care provider. Document Revised: 12/11/2018 Document Reviewed: 09/24/2016 Elsevier Patient Education  2020 Elsevier Inc.  

## 2019-10-14 NOTE — MAU Provider Note (Signed)
Patient Allison Pratt is a 34 y.o.  G2P0 at 4-[redacted] weeks pregnant  Here with complaints of vaginal bleeding that started last night. She denies any pain.  She has been getting serial quantative at her doctors' office after an ectopic pregnancy in May. Her LMP was 09-06-2019 Per Care everywhere, her quants have been the following:   1/27  72 2/1   138 2/11  66 (today in MAU).   Patient reports that she called her provider's in [place] today and reported bleeding that started last night. She says that her provider's office told her to monitor the bleeding and that if it got worse, to call for an appointment. She wanted to be seen today, so she came to [facility] for a second opinion.   US shows that patient has nothing in the uterus, and quant today is 38, which is  consistent with miscarriage. Patient desires discharge and to follow up at her doctor's office in [place]. Reviewed strict bleeding and pain precautions reviewed.   Allison Pratt 10/14/2019, 3:14 PM

## 2019-10-14 NOTE — MAU Note (Signed)
Presents with c/o VB that began last night.  +HPT, LMP 09/04/2019.  States having blood work done twice per week to check HCG levels secondary had miscarriage last May.

## 2019-10-15 ENCOUNTER — Telehealth: Payer: Self-pay | Admitting: Obstetrics and Gynecology

## 2019-10-15 NOTE — Telephone Encounter (Signed)
Return call to patient. Patient with lots of questions about if she she needs to be on antibiotics for a miscarriage. She wanted to know what the proper follow-up would be for a miscarriage and to make sure she wasn't at danger of losing her other fallopian tube (lost one in May for an ectopic pregnancy). Explained that antibiotics are only indicated for infection prevention; which she was not at risk of at this time. Advised that with a 50% drop in HCG level is diagnosis of miscarriage and her HCG levels should followed weekly until they reach 0. She will need to call her OB office to get that scheduled. Bleeding precautions reviewed. Seek emergency care:  If you have heavier bleeding that soaks through more that 2 pads per hour for an hour or more  If you bleed so much that you feel like you might pass out or you do pass out  If you have significant abdominal pain that is not improved with Tylenol 1000 mg  If you develop a fever > 100.5   Patient verbalized an understanding of the plan of care and agrees.   Laury Deep, CNM  10/15/2019 9:27 AM

## 2020-02-02 ENCOUNTER — Encounter: Payer: Self-pay | Admitting: Adult Health

## 2020-02-02 ENCOUNTER — Ambulatory Visit (INDEPENDENT_AMBULATORY_CARE_PROVIDER_SITE_OTHER): Payer: BC Managed Care – PPO | Admitting: Adult Health

## 2020-02-02 VITALS — BP 95/59 | HR 78 | Ht 63.0 in | Wt 179.8 lb

## 2020-02-02 DIAGNOSIS — N926 Irregular menstruation, unspecified: Secondary | ICD-10-CM | POA: Diagnosis not present

## 2020-02-02 DIAGNOSIS — Z3201 Encounter for pregnancy test, result positive: Secondary | ICD-10-CM | POA: Insufficient documentation

## 2020-02-02 DIAGNOSIS — O3680X0 Pregnancy with inconclusive fetal viability, not applicable or unspecified: Secondary | ICD-10-CM | POA: Insufficient documentation

## 2020-02-02 DIAGNOSIS — O09291 Supervision of pregnancy with other poor reproductive or obstetric history, first trimester: Secondary | ICD-10-CM

## 2020-02-02 DIAGNOSIS — Z8759 Personal history of other complications of pregnancy, childbirth and the puerperium: Secondary | ICD-10-CM

## 2020-02-02 DIAGNOSIS — Z3A01 Less than 8 weeks gestation of pregnancy: Secondary | ICD-10-CM | POA: Diagnosis not present

## 2020-02-02 LAB — POCT URINE PREGNANCY: Preg Test, Ur: POSITIVE — AB

## 2020-02-02 NOTE — Patient Instructions (Signed)
First Trimester of Pregnancy The first trimester of pregnancy is from week 1 until the end of week 13 (months 1 through 3). A week after a sperm fertilizes an egg, the egg will implant on the wall of the uterus. This embryo will begin to develop into a baby. Genes from you and your partner will form the baby. The female genes will determine whether the baby will be a boy or a girl. At 6-8 weeks, the eyes and face will be formed, and the heartbeat can be seen on ultrasound. At the end of 12 weeks, all the baby's organs will be formed. Now that you are pregnant, you will want to do everything you can to have a healthy baby. Two of the most important things are to get good prenatal care and to follow your health care provider's instructions. Prenatal care is all the medical care you receive before the baby's birth. This care will help prevent, find, and treat any problems during the pregnancy and childbirth. Body changes during your first trimester Your body goes through many changes during pregnancy. The changes vary from woman to woman.  You may gain or lose a couple of pounds at first.  You may feel sick to your stomach (nauseous) and you may throw up (vomit). If the vomiting is uncontrollable, call your health care provider.  You may tire easily.  You may develop headaches that can be relieved by medicines. All medicines should be approved by your health care provider.  You may urinate more often. Painful urination may mean you have a bladder infection.  You may develop heartburn as a result of your pregnancy.  You may develop constipation because certain hormones are causing the muscles that push stool through your intestines to slow down.  You may develop hemorrhoids or swollen veins (varicose veins).  Your breasts may begin to grow larger and become tender. Your nipples may stick out more, and the tissue that surrounds them (areola) may become darker.  Your gums may bleed and may be  sensitive to brushing and flossing.  Dark spots or blotches (chloasma, mask of pregnancy) may develop on your face. This will likely fade after the baby is born.  Your menstrual periods will stop.  You may have a loss of appetite.  You may develop cravings for certain kinds of food.  You may have changes in your emotions from day to day, such as being excited to be pregnant or being concerned that something may go wrong with the pregnancy and baby.  You may have more vivid and strange dreams.  You may have changes in your hair. These can include thickening of your hair, rapid growth, and changes in texture. Some women also have hair loss during or after pregnancy, or hair that feels dry or thin. Your hair will most likely return to normal after your baby is born. What to expect at prenatal visits During a routine prenatal visit:  You will be weighed to make sure you and the baby are growing normally.  Your blood pressure will be taken.  Your abdomen will be measured to track your baby's growth.  The fetal heartbeat will be listened to between weeks 10 and 14 of your pregnancy.  Test results from any previous visits will be discussed. Your health care provider may ask you:  How you are feeling.  If you are feeling the baby move.  If you have had any abnormal symptoms, such as leaking fluid, bleeding, severe headaches, or abdominal   cramping.  If you are using any tobacco products, including cigarettes, chewing tobacco, and electronic cigarettes.  If you have any questions. Other tests that may be performed during your first trimester include:  Blood tests to find your blood type and to check for the presence of any previous infections. The tests will also be used to check for low iron levels (anemia) and protein on red blood cells (Rh antibodies). Depending on your risk factors, or if you previously had diabetes during pregnancy, you may have tests to check for high blood sugar  that affects pregnant women (gestational diabetes).  Urine tests to check for infections, diabetes, or protein in the urine.  An ultrasound to confirm the proper growth and development of the baby.  Fetal screens for spinal cord problems (spina bifida) and Down syndrome.  HIV (human immunodeficiency virus) testing. Routine prenatal testing includes screening for HIV, unless you choose not to have this test.  You may need other tests to make sure you and the baby are doing well. Follow these instructions at home: Medicines  Follow your health care provider's instructions regarding medicine use. Specific medicines may be either safe or unsafe to take during pregnancy.  Take a prenatal vitamin that contains at least 600 micrograms (mcg) of folic acid.  If you develop constipation, try taking a stool softener if your health care provider approves. Eating and drinking   Eat a balanced diet that includes fresh fruits and vegetables, whole grains, good sources of protein such as meat, eggs, or tofu, and low-fat dairy. Your health care provider will help you determine the amount of weight gain that is right for you.  Avoid raw meat and uncooked cheese. These carry germs that can cause birth defects in the baby.  Eating four or five small meals rather than three large meals a day may help relieve nausea and vomiting. If you start to feel nauseous, eating a few soda crackers can be helpful. Drinking liquids between meals, instead of during meals, also seems to help ease nausea and vomiting.  Limit foods that are high in fat and processed sugars, such as fried and sweet foods.  To prevent constipation: ? Eat foods that are high in fiber, such as fresh fruits and vegetables, whole grains, and beans. ? Drink enough fluid to keep your urine clear or pale yellow. Activity  Exercise only as directed by your health care provider. Most women can continue their usual exercise routine during  pregnancy. Try to exercise for 30 minutes at least 5 days a week. Exercising will help you: ? Control your weight. ? Stay in shape. ? Be prepared for labor and delivery.  Experiencing pain or cramping in the lower abdomen or lower back is a good sign that you should stop exercising. Check with your health care provider before continuing with normal exercises.  Try to avoid standing for long periods of time. Move your legs often if you must stand in one place for a long time.  Avoid heavy lifting.  Wear low-heeled shoes and practice good posture.  You may continue to have sex unless your health care provider tells you not to. Relieving pain and discomfort  Wear a good support bra to relieve breast tenderness.  Take warm sitz baths to soothe any pain or discomfort caused by hemorrhoids. Use hemorrhoid cream if your health care provider approves.  Rest with your legs elevated if you have leg cramps or low back pain.  If you develop varicose veins in   your legs, wear support hose. Elevate your feet for 15 minutes, 3-4 times a day. Limit salt in your diet. Prenatal care  Schedule your prenatal visits by the twelfth week of pregnancy. They are usually scheduled monthly at first, then more often in the last 2 months before delivery.  Write down your questions. Take them to your prenatal visits.  Keep all your prenatal visits as told by your health care provider. This is important. Safety  Wear your seat belt at all times when driving.  Make a list of emergency phone numbers, including numbers for family, friends, the hospital, and police and fire departments. General instructions  Ask your health care provider for a referral to a local prenatal education class. Begin classes no later than the beginning of month 6 of your pregnancy.  Ask for help if you have counseling or nutritional needs during pregnancy. Your health care provider can offer advice or refer you to specialists for help  with various needs.  Do not use hot tubs, steam rooms, or saunas.  Do not douche or use tampons or scented sanitary pads.  Do not cross your legs for long periods of time.  Avoid cat litter boxes and soil used by cats. These carry germs that can cause birth defects in the baby and possibly loss of the fetus by miscarriage or stillbirth.  Avoid all smoking, herbs, alcohol, and medicines not prescribed by your health care provider. Chemicals in these products affect the formation and growth of the baby.  Do not use any products that contain nicotine or tobacco, such as cigarettes and e-cigarettes. If you need help quitting, ask your health care provider. You may receive counseling support and other resources to help you quit.  Schedule a dentist appointment. At home, brush your teeth with a soft toothbrush and be gentle when you floss. Contact a health care provider if:  You have dizziness.  You have mild pelvic cramps, pelvic pressure, or nagging pain in the abdominal area.  You have persistent nausea, vomiting, or diarrhea.  You have a bad smelling vaginal discharge.  You have pain when you urinate.  You notice increased swelling in your face, hands, legs, or ankles.  You are exposed to fifth disease or chickenpox.  You are exposed to German measles (rubella) and have never had it. Get help right away if:  You have a fever.  You are leaking fluid from your vagina.  You have spotting or bleeding from your vagina.  You have severe abdominal cramping or pain.  You have rapid weight gain or loss.  You vomit blood or material that looks like coffee grounds.  You develop a severe headache.  You have shortness of breath.  You have any kind of trauma, such as from a fall or a car accident. Summary  The first trimester of pregnancy is from week 1 until the end of week 13 (months 1 through 3).  Your body goes through many changes during pregnancy. The changes vary from  woman to woman.  You will have routine prenatal visits. During those visits, your health care provider will examine you, discuss any test results you may have, and talk with you about how you are feeling. This information is not intended to replace advice given to you by your health care provider. Make sure you discuss any questions you have with your health care provider. Document Revised: 08/01/2017 Document Reviewed: 07/31/2016 Elsevier Patient Education  2020 Elsevier Inc.  

## 2020-02-02 NOTE — Progress Notes (Signed)
  Subjective:     Patient ID: Allison Pratt, female   DOB: [birth year], 34 y.o.   MRN: [id number]  HPI Allison Pratt is a 34 year old black female, married, in for UPT, has missed a period and had 3+HPTs.She says she had a miscarriage in February 34 and ectopic May 2020, and is nervous. She works in Company secretary at [employer].  PCP is Dr Quintin Alto.   Review of Systems +missed period,3+HPTs +breat tenderness Denies any bleeding or pain Reviewed past medical,surgical, social and family history. Reviewed medications and allergies.     Objective:   Physical Exam BP (!) 95/59 (BP Location: Left Arm, Patient Position: Sitting, Cuff Size: Normal)   Pulse 78   Ht 5\' 3"  (1.6 m)   Wt 179 lb 12.8 oz (81.6 kg)   LMP 12/31/2019 (Within Days)   Breastfeeding No   BMI 31.85 kg/m UPT is +,a bout 4+5 weeks by LMP with EDD 10/06/20.Skin warm and dry. Neck: mid line trachea, normal thyroid, good ROM, no lymphadenopathy noted. Lungs: clear to ausculation bilaterally. Cardiovascular: regular rate and rhythm. Abdomen is soft and non tender Fall risk is low PHQ 2 score is 1. Blood type is O+ in CHL.      Assessment:     1. Missed period  2. Positive pregnancy test Continue PNNV  3. Less than [redacted] weeks gestation of pregnancy Return in 3 weeks for dating [place]  4. History of ectopic pregnancy   5. History of miscarriage, currently pregnant, first trimester Check QHCG and progesterone level   6. Encounter to determine fetal viability of pregnancy, single or unspecified fetus Return in 8 weeks for dating [place]    Plan:     Review handout on First Trimester and by [facility]

## 2020-02-03 LAB — PROGESTERONE: Progesterone: 14 ng/mL

## 2020-02-03 LAB — BETA HCG QUANT (REF LAB): hCG Quant: 801 m[IU]/mL

## 2020-02-23 ENCOUNTER — Ambulatory Visit (INDEPENDENT_AMBULATORY_CARE_PROVIDER_SITE_OTHER): Payer: BC Managed Care – PPO

## 2020-02-23 DIAGNOSIS — Z3A01 Less than 8 weeks gestation of pregnancy: Secondary | ICD-10-CM

## 2020-02-23 DIAGNOSIS — O3680X0 Pregnancy with inconclusive fetal viability, not applicable or unspecified: Secondary | ICD-10-CM

## 2020-02-23 NOTE — Progress Notes (Signed)
[place] 7+5 wks,single IUP with YS,positive fht 140 bpm,crl 11.34 mm,anterior fibroid 3.3 x 2.3 x 2.1 cm,subchorionic hemorrhage 2.8 x 1.2 x 2.9 cm

## 2020-03-28 ENCOUNTER — Other Ambulatory Visit: Payer: Self-pay | Admitting: Obstetrics & Gynecology

## 2020-03-28 DIAGNOSIS — Z3682 Encounter for antenatal screening for nuchal translucency: Secondary | ICD-10-CM

## 2020-03-29 ENCOUNTER — Ambulatory Visit: Payer: Medicaid Other | Admitting: *Deleted

## 2020-03-29 ENCOUNTER — Ambulatory Visit (INDEPENDENT_AMBULATORY_CARE_PROVIDER_SITE_OTHER): Payer: BC Managed Care – PPO | Admitting: Advanced Practice Midwife

## 2020-03-29 ENCOUNTER — Other Ambulatory Visit (HOSPITAL_COMMUNITY)
Admission: RE | Admit: 2020-03-29 | Discharge: 2020-03-29 | Disposition: A | Discharge status: Home / Self Care | Payer: BC Managed Care – PPO | Source: Ambulatory Visit | Attending: Advanced Practice Midwife | Admitting: Advanced Practice Midwife

## 2020-03-29 ENCOUNTER — Encounter: Payer: Self-pay | Admitting: Advanced Practice Midwife

## 2020-03-29 ENCOUNTER — Ambulatory Visit (INDEPENDENT_AMBULATORY_CARE_PROVIDER_SITE_OTHER): Payer: BC Managed Care – PPO

## 2020-03-29 VITALS — BP 100/68 | HR 70 | Wt 172.0 lb

## 2020-03-29 DIAGNOSIS — O3411 Maternal care for benign tumor of corpus uteri, first trimester: Secondary | ICD-10-CM

## 2020-03-29 DIAGNOSIS — Z3A12 12 weeks gestation of pregnancy: Secondary | ICD-10-CM

## 2020-03-29 DIAGNOSIS — Z3480 Encounter for supervision of other normal pregnancy, unspecified trimester: Secondary | ICD-10-CM | POA: Insufficient documentation

## 2020-03-29 DIAGNOSIS — D259 Leiomyoma of uterus, unspecified: Secondary | ICD-10-CM | POA: Diagnosis not present

## 2020-03-29 DIAGNOSIS — Z3481 Encounter for supervision of other normal pregnancy, first trimester: Secondary | ICD-10-CM | POA: Insufficient documentation

## 2020-03-29 DIAGNOSIS — Z3490 Encounter for supervision of normal pregnancy, unspecified, unspecified trimester: Secondary | ICD-10-CM | POA: Insufficient documentation

## 2020-03-29 DIAGNOSIS — Z3682 Encounter for antenatal screening for nuchal translucency: Secondary | ICD-10-CM | POA: Diagnosis not present

## 2020-03-29 LAB — POCT URINALYSIS DIPSTICK OB
Blood, UA: NEGATIVE
Glucose, UA: NEGATIVE
Ketones, UA: NEGATIVE
Leukocytes, UA: NEGATIVE
Nitrite, UA: NEGATIVE
POC,PROTEIN,UA: NEGATIVE

## 2020-03-29 MED ORDER — BLOOD PRESSURE MONITOR MISC: 0 refills | Status: AC

## 2020-03-29 NOTE — Progress Notes (Signed)
INITIAL OBSTETRICAL VISIT Patient name: Allison Pratt MRN [id number]  Date of birth: 11-May-1986 Chief Complaint:   Initial Prenatal Visit (nt/it)  History of Present Illness:   Allison Pratt is a 34 y.o. G35P0020 African American female at [redacted]w[redacted]d by LMP c/w u/s at 7.5 weeks with an Estimated Date of Delivery: 10/06/20 being seen today for her initial obstetrical visit.   Her obstetrical history is significant for primigravida (hx EAB and ectopic).   Today she reports no complaints.  Depression screen [facility] 2/9 03/29/2020 02/02/2020  Decreased Interest 1 1  Down, Depressed, Hopeless 0 0  PHQ - 2 Score 1 1  Altered sleeping 0 -  Tired, decreased energy 2 -  Change in appetite 1 -  Feeling bad or failure about yourself  0 -  Trouble concentrating 0 -  Moving slowly or fidgety/restless 0 -  Suicidal thoughts 0 -  PHQ-9 Score 4 -    Patient's last menstrual period was 12/31/2019 (within days). Last pap not sure. Results were: per pt, never had abnl Pap Review of Systems:   Pertinent items are noted in HPI Denies cramping/contractions, leakage of fluid, vaginal bleeding, abnormal vaginal discharge w/ itching/odor/irritation, headaches, visual changes, shortness of breath, chest pain, abdominal pain, severe nausea/vomiting, or problems with urination or bowel movements unless otherwise stated above.  Pertinent History Reviewed:  Reviewed past medical,surgical, social, obstetrical and family history.  Reviewed problem list, medications and allergies. OB History  Gravida Para Term Preterm AB Living  3       2    SAB TAB Ectopic Multiple Live Births  1   1        # Outcome Date GA Lbr Len/2nd Weight Sex Delivery Anes PTL Lv  3 Current           2 Ectopic           1 SAB            Physical Assessment:   Vitals:   03/29/20 0927  BP: 100/68  Pulse: 70  Weight: 172 lb (78 kg)  Body mass index is 30.47 kg/m.       Physical Examination:  General appearance - well appearing, and in  no distress  Mental status - alert, oriented to person, place, and time  Psych:  She has a normal mood and affect  Skin - warm and dry, normal color, no suspicious lesions noted  Chest - effort normal, all lung fields clear to auscultation bilaterally  Heart - normal rate and regular rhythm  Abdomen - soft, nontender  Extremities:  No swelling or varicosities noted  Pelvic - VULVA: normal appearing vulva with no masses, tenderness or lesions  VAGINA: normal appearing vagina with normal color and discharge, no lesions  CERVIX: normal appearing cervix without discharge or lesions, no CMT  Thin prep pap is done with HR HPV cotesting  TODAY'S NT [place] 12+5 wks,measurements c/w dates,crl 58.25 mm,fhr 146 bpm,fundal fibroid 3.9 x  2.9 x 2.9 cm,normal ovaries,NB present,NT 1.2 mm  Results for orders placed or performed in visit on 03/29/20 (from the past 24 hour(s))  POC Urinalysis Dipstick OB   Collection Time: 03/29/20  9:56 AM  Result Value Ref Range   Color, UA     Clarity, UA     Glucose, UA Negative Negative   Bilirubin, UA     Ketones, UA neg    Spec Grav, UA     Blood, UA neg  pH, UA     POC,PROTEIN,UA Negative Negative, Trace, Small (1+), Moderate (2+), Large (3+), 4+   Urobilinogen, UA     Nitrite, UA neg    Leukocytes, UA Negative Negative   Appearance     Odor      Assessment & Plan:  1) Low-Risk Pregnancy G3P0020 at [redacted]w[redacted]d with an Estimated Date of Delivery: 10/06/20   2) Initial OB visit  3) Fundal fibroid, 3cm  Meds:  Meds ordered this encounter  Medications  . Blood Pressure Monitor MISC    Sig: For regular home bp monitoring during pregnancy    Dispense:  1 each    Refill:  0    Z34.90    Initial labs obtained Continue prenatal vitamins Reviewed n/v relief measures and warning s/s to report Reviewed recommended weight gain based on pre-gravid BMI Encouraged well-balanced diet Genetic & carrier screening discussed: requests [place]or[employer] and NT/IT, requests  [place] [address]  Ultrasound discussed; fetal survey: requested [place] completed> form faxed if has or is planning to apply for medicaid The nature of [place] for [employer] with multiple MDs and other Advanced Practice Providers was explained to patient; also emphasized that fellows, residents, and students are part of our team. Order for home bp cuff. Check bp weekly, let us know if >140/90.   No indications for ASA therapy or early Hgb A1c (per uptodate)  Follow-up: Return in about 6 weeks (around 05/10/2020) for [place], [place]: Anatomy, 2nd IT, in person.   Orders Placed This Encounter  Procedures  . Urine Culture  . US OB Comp + 14 Wk  . Integrated 1  . Genetic Screening  . CBC/D/Plt+RPR+Rh+ABO+Rub Ab...  . Pain Management Screening Profile (10S)  . POC Urinalysis Dipstick OB    Myrtis Ser [facility] 03/29/2020 12:26 PM

## 2020-03-29 NOTE — Patient Instructions (Signed)
Allison Pratt, I greatly value your feedback.  If you receive a survey following your visit with [place] today, we appreciate you taking the time to fill it out.  Thanks, Derrill Memo CNM   [place] at [facility] ([place], [place] [address]) Entrance C, located off of [address]   Nausea & Vomiting  Have saltine crackers or pretzels by your bed and eat a few bites before you raise your head out of bed in the morning  Eat small frequent meals throughout the day instead of large meals  Drink plenty of fluids throughout the day to stay hydrated, just don't drink a lot of fluids with your meals.  This can make your stomach fill up faster making you feel sick  Do not brush your teeth right after you eat  Products with real ginger are good for nausea, like ginger ale and ginger hard candy Make sure it says made with real ginger!  Sucking on sour candy like lemon heads is also good for nausea  If your prenatal vitamins make you nauseated, take them at night so you will sleep through the nausea  Sea Bands  If you feel like you need medicine for the nausea & vomiting please let us know  If you are unable to keep any fluids or food down please let us know   Constipation  Drink plenty of fluid, preferably water, throughout the day  Eat foods high in fiber such as fruits, vegetables, and grains  Exercise, such as walking, is a good way to keep your bowels regular  Drink warm fluids, especially warm prune juice, or decaf coffee  Eat a 1/2 cup of real oatmeal (not instant), 1/2 cup applesauce, and 1/2-1 cup warm prune juice every day  If needed, you may take Colace (docusate sodium) stool softener once or twice a day to help keep the stool soft.   If you still are having problems with constipation, you may take Miralax once daily as needed to help keep your bowels regular.   Home Blood Pressure Monitoring for Patients   Your  provider has recommended that you check your blood pressure (BP) at least once a week at home. If you do not have a blood pressure cuff at home, one will be provided for you. Contact your provider if you have not received your monitor within 1 week.   Helpful Tips for Accurate Home Blood Pressure Checks  . Don't smoke, exercise, or drink caffeine 30 minutes before checking your BP . Use the restroom before checking your BP (a full bladder can raise your pressure) . Relax in a comfortable upright chair . Feet on the ground . Left arm resting comfortably on a flat surface at the level of your heart . Legs uncrossed . Back supported . Sit quietly and don't talk . Place the cuff on your bare arm . Adjust snuggly, so that only two fingertips can fit between your skin and the top of the cuff . Check 2 readings separated by at least one minute . Keep a log of your BP readings . For a visual, please reference this diagram: http://ccnc.care/bpdiagram  Provider Name: [facility]     Phone: [phone]  Zone 1: ALL CLEAR  Continue to monitor your symptoms:  . BP reading is less than 140 (top number) or less than 90 (bottom number)  . No right upper stomach pain . No headaches or seeing  spots . No feeling nauseated or throwing up . No swelling in face and hands  Zone 2: CAUTION Call your doctor's office for any of the following:  . BP reading is greater than 140 (top number) or greater than 90 (bottom number)  . Stomach pain under your ribs in the middle or right side . Headaches or seeing spots . Feeling nauseated or throwing up . Swelling in face and hands  Zone 3: EMERGENCY  Seek immediate medical care if you have any of the following:  . BP reading is greater than160 (top number) or greater than 110 (bottom number) . Severe headaches not improving with Tylenol . Serious difficulty catching your breath . Any worsening symptoms from Zone 2    First Trimester of Pregnancy The  first trimester of pregnancy is from week 1 until the end of week 12 (months 1 through 3). A week after a sperm fertilizes an egg, the egg will implant on the wall of the uterus. This embryo will begin to develop into a baby. Genes from you and your partner are forming the baby. The female genes determine whether the baby is a boy or a girl. At 6-8 weeks, the eyes and face are formed, and the heartbeat can be seen on ultrasound. At the end of 12 weeks, all the baby's organs are formed.  Now that you are pregnant, you will want to do everything you can to have a healthy baby. Two of the most important things are to get good prenatal care and to follow your health care provider's instructions. Prenatal care is all the medical care you receive before the baby's birth. This care will help prevent, find, and treat any problems during the pregnancy and childbirth. BODY CHANGES Your body goes through many changes during pregnancy. The changes vary from woman to woman.   You may gain or lose a couple of pounds at first.  You may feel sick to your stomach (nauseous) and throw up (vomit). If the vomiting is uncontrollable, call your health care provider.  You may tire easily.  You may develop headaches that can be relieved by medicines approved by your health care provider.  You may urinate more often. Painful urination may mean you have a bladder infection.  You may develop heartburn as a result of your pregnancy.  You may develop constipation because certain hormones are causing the muscles that push waste through your intestines to slow down.  You may develop hemorrhoids or swollen, bulging veins (varicose veins).  Your breasts may begin to grow larger and become tender. Your nipples may stick out more, and the tissue that surrounds them (areola) may become darker.  Your gums may bleed and may be sensitive to brushing and flossing.  Dark spots or blotches (chloasma, mask of pregnancy) may develop on  your face. This will likely fade after the baby is born.  Your menstrual periods will stop.  You may have a loss of appetite.  You may develop cravings for certain kinds of food.  You may have changes in your emotions from day to day, such as being excited to be pregnant or being concerned that something may go wrong with the pregnancy and baby.  You may have more vivid and strange dreams.  You may have changes in your hair. These can include thickening of your hair, rapid growth, and changes in texture. Some women also have hair loss during or after pregnancy, or hair that feels dry or thin. Your hair  will most likely return to normal after your baby is born. WHAT TO EXPECT AT YOUR PRENATAL VISITS During a routine prenatal visit:  You will be weighed to make sure you and the baby are growing normally.  Your blood pressure will be taken.  Your abdomen will be measured to track your baby's growth.  The fetal heartbeat will be listened to starting around week 10 or 12 of your pregnancy.  Test results from any previous visits will be discussed. Your health care provider may ask you:  How you are feeling.  If you are feeling the baby move.  If you have had any abnormal symptoms, such as leaking fluid, bleeding, severe headaches, or abdominal cramping.  If you have any questions. Other tests that may be performed during your first trimester include:  Blood tests to find your blood type and to check for the presence of any previous infections. They will also be used to check for low iron levels (anemia) and Rh antibodies. Later in the pregnancy, blood tests for diabetes will be done along with other tests if problems develop.  Urine tests to check for infections, diabetes, or protein in the urine.  An ultrasound to confirm the proper growth and development of the baby.  An amniocentesis to check for possible genetic problems.  Fetal screens for spina bifida and Down  syndrome.  You may need other tests to make sure you and the baby are doing well. HOME CARE INSTRUCTIONS  Medicines  Follow your health care provider's instructions regarding medicine use. Specific medicines may be either safe or unsafe to take during pregnancy.  Take your prenatal vitamins as directed.  If you develop constipation, try taking a stool softener if your health care provider approves. Diet  Eat regular, well-balanced meals. Choose a variety of foods, such as meat or vegetable-based protein, fish, milk and low-fat dairy products, vegetables, fruits, and whole grain breads and cereals. Your health care provider will help you determine the amount of weight gain that is right for you.  Avoid raw meat and uncooked cheese. These carry germs that can cause birth defects in the baby.  Eating four or five small meals rather than three large meals a day may help relieve nausea and vomiting. If you start to feel nauseous, eating a few soda crackers can be helpful. Drinking liquids between meals instead of during meals also seems to help nausea and vomiting.  If you develop constipation, eat more high-fiber foods, such as fresh vegetables or fruit and whole grains. Drink enough fluids to keep your urine clear or pale yellow. Activity and Exercise  Exercise only as directed by your health care provider. Exercising will help you:  Control your weight.  Stay in shape.  Be prepared for labor and delivery.  Experiencing pain or cramping in the lower abdomen or low back is a good sign that you should stop exercising. Check with your health care provider before continuing normal exercises.  Try to avoid standing for long periods of time. Move your legs often if you must stand in one place for a long time.  Avoid heavy lifting.  Wear low-heeled shoes, and practice good posture.  You may continue to have sex unless your health care provider directs you otherwise. Relief of Pain or  Discomfort  Wear a good support bra for breast tenderness.    Take warm sitz baths to soothe any pain or discomfort caused by hemorrhoids. Use hemorrhoid cream if your health care provider approves.  Rest with your legs elevated if you have leg cramps or low back pain.  If you develop varicose veins in your legs, wear support hose. Elevate your feet for 15 minutes, 3-4 times a day. Limit salt in your diet. Prenatal Care  Schedule your prenatal visits by the twelfth week of pregnancy. They are usually scheduled monthly at first, then more often in the last 2 months before delivery.  Write down your questions. Take them to your prenatal visits.  Keep all your prenatal visits as directed by your health care provider. Safety  Wear your seat belt at all times when driving.  Make a list of emergency phone numbers, including numbers for family, friends, the hospital, and police and fire departments. General Tips  Ask your health care provider for a referral to a local prenatal education class. Begin classes no later than at the beginning of month 6 of your pregnancy.  Ask for help if you have counseling or nutritional needs during pregnancy. Your health care provider can offer advice or refer you to specialists for help with various needs.  Do not use hot tubs, steam rooms, or saunas.  Do not douche or use tampons or scented sanitary pads.  Do not cross your legs for long periods of time.  Avoid cat litter boxes and soil used by cats. These carry germs that can cause birth defects in the baby and possibly loss of the fetus by miscarriage or stillbirth.  Avoid all smoking, herbs, alcohol, and medicines not prescribed by your health care provider. Chemicals in these affect the formation and growth of the baby.  Schedule a dentist appointment. At home, brush your teeth with a soft toothbrush and be gentle when you floss. SEEK MEDICAL CARE IF:   You have dizziness.  You have mild  pelvic cramps, pelvic pressure, or nagging pain in the abdominal area.  You have persistent nausea, vomiting, or diarrhea.  You have a bad smelling vaginal discharge.  You have pain with urination.  You notice increased swelling in your face, hands, legs, or ankles. SEEK IMMEDIATE MEDICAL CARE IF:   You have a fever.  You are leaking fluid from your vagina.  You have spotting or bleeding from your vagina.  You have severe abdominal cramping or pain.  You have rapid weight gain or loss.  You vomit blood or material that looks like coffee grounds.  You are exposed to [place] measles and have never had them.  You are exposed to fifth disease or chickenpox.  You develop a severe headache.  You have shortness of breath.  You have any kind of trauma, such as from a fall or a car accident. Document Released: 08/13/2001 Document Revised: 01/03/2014 Document Reviewed: 06/29/2013 [facility] Patient Information 2015 [place], [place]. This information is not intended to replace advice given to you by your health care provider. Make sure you discuss any questions you have with your health care provider.

## 2020-03-29 NOTE — Progress Notes (Signed)
[place] 12+5 wks,measurements c/w dates,crl 58.25 mm,fhr 146 bpm,fundal fibroid 3.9 x  2.9 x 2.9 cm,normal ovaries,NB present,NT 1.2 mm

## 2020-03-30 LAB — CYTOLOGY - PAP
Adequacy: ABSENT
Chlamydia: NEGATIVE
Comment: NEGATIVE
Comment: NEGATIVE
Comment: NORMAL
Diagnosis: NEGATIVE
High risk HPV: NEGATIVE
Neisseria Gonorrhea: NEGATIVE

## 2020-03-31 ENCOUNTER — Encounter: Payer: Self-pay | Admitting: Advanced Practice Midwife

## 2020-03-31 DIAGNOSIS — F1290 Cannabis use, unspecified, uncomplicated: Secondary | ICD-10-CM | POA: Insufficient documentation

## 2020-03-31 LAB — CBC/D/PLT+RPR+RH+ABO+RUB AB...
Antibody Screen: NEGATIVE
Basophils Absolute: 0 10*3/uL (ref 0.0–0.2)
Basos: 0 %
EOS (ABSOLUTE): 0.1 10*3/uL (ref 0.0–0.4)
Eos: 1 %
HCV Ab: 0.1 s/co ratio (ref 0.0–0.9)
HIV Screen 4th Generation wRfx: NONREACTIVE
Hematocrit: 39.6 % (ref 34.0–46.6)
Hemoglobin: 13.3 g/dL (ref 11.1–15.9)
Hepatitis B Surface Ag: NEGATIVE
Immature Grans (Abs): 0 10*3/uL (ref 0.0–0.1)
Immature Granulocytes: 0 %
Lymphocytes Absolute: 1.9 10*3/uL (ref 0.7–3.1)
Lymphs: 29 %
MCH: 31.8 pg (ref 26.6–33.0)
MCHC: 33.6 g/dL (ref 31.5–35.7)
MCV: 95 fL (ref 79–97)
Monocytes Absolute: 0.3 10*3/uL (ref 0.1–0.9)
Monocytes: 4 %
Neutrophils Absolute: 4.2 10*3/uL (ref 1.4–7.0)
Neutrophils: 66 %
Platelets: 266 10*3/uL (ref 150–450)
RBC: 4.18 x10E6/uL (ref 3.77–5.28)
RDW: 15.5 % — ABNORMAL HIGH (ref 11.7–15.4)
RPR Ser Ql: NONREACTIVE
Rh Factor: POSITIVE
Rubella Antibodies, IGG: 1.68 index (ref >0.99)
WBC: 6.4 10*3/uL (ref 3.4–10.8)

## 2020-03-31 LAB — INTEGRATED 1
Crown Rump Length: 58.3 mm
Gest. Age on Collection Date: 12.1 weeks
Maternal Age at EDD: 33.9 yr
Nuchal Translucency (NT): 1.2 mm
Number of Fetuses: 1
PAPP-A Value: 577.2 ng/mL
Weight: 172 [lb_av]

## 2020-03-31 LAB — PMP SCREEN PROFILE (10S), URINE
Amphetamine Scrn, Ur: NEGATIVE ng/mL
BARBITURATE SCREEN URINE: NEGATIVE ng/mL
BENZODIAZEPINE SCREEN, URINE: NEGATIVE ng/mL
CANNABINOIDS UR QL SCN: POSITIVE ng/mL — AB
Cocaine (Metab) Scrn, Ur: NEGATIVE ng/mL
Creatinine(Crt), U: 214.3 mg/dL (ref 20.0–300.0)
Methadone Screen, Urine: NEGATIVE ng/mL
OXYCODONE+OXYMORPHONE UR QL SCN: NEGATIVE ng/mL
Opiate Scrn, Ur: NEGATIVE ng/mL
Ph of Urine: 5.9 (ref 4.5–8.9)
Phencyclidine Qn, Ur: NEGATIVE ng/mL
Propoxyphene Scrn, Ur: NEGATIVE ng/mL

## 2020-03-31 LAB — HCV INTERPRETATION

## 2020-03-31 LAB — URINE CULTURE

## 2020-04-11 ENCOUNTER — Encounter: Payer: Self-pay | Admitting: *Deleted

## 2020-04-11 DIAGNOSIS — Z3480 Encounter for supervision of other normal pregnancy, unspecified trimester: Secondary | ICD-10-CM

## 2020-04-19 ENCOUNTER — Telehealth: Payer: BC Managed Care – PPO | Admitting: Family

## 2020-04-19 DIAGNOSIS — U071 COVID-19: Secondary | ICD-10-CM

## 2020-04-19 MED ORDER — PREDNISONE 10 MG (21) PO TBPK: ORAL_TABLET | ORAL | 0 refills | Status: DC

## 2020-04-19 MED ORDER — ALBUTEROL SULFATE HFA 108 (90 BASE) MCG/ACT IN AERS: 2.0000 | INHALATION_SPRAY | Freq: Four times a day (QID) | RESPIRATORY_TRACT | 0 refills | Status: DC | PRN

## 2020-04-19 NOTE — Progress Notes (Signed)
E-Visit for Corona Virus Screening  Your current symptoms could be consistent with the coronavirus.  Many health care providers can now test patients at their office but not all are.   has multiple testing sites. For information on our [place] testing locations and hours go to [email or web address]  We are enrolling you in our [place] for [place] . Daily you will receive a questionnaire within the [place] website. Our COVID 19 response team will be monitoring your responses daily.  Testing Information: The COVID-19 Community Testing sites will begin testing BY APPOINTMENT ONLY.  You can schedule online at [email or web address]  If you do not have access to a smart phone or computer you may call [phone] for an appointment.   Additional testing sites in the Community:  . For [facility] Testing sites in [facility]  [email or web address]  . For Pop-up testing sites in [place]  [email or web address].co.[email or web address]  . For Testing sites with regular hours [email or web address]  . For [place] [email or web address]  . For [facility] [email or web address]  . For [facility] testing in [place] and [employer] [email or web address]  . For [facility] testing in [facility]   [email or web address]  For  more information about community testing call [phone]   Please quarantine yourself while awaiting your test results. Please stay home for a minimum of 10 days from the first day of illness with improving symptoms and you have had 24 hours of no fever (without the use of Tylenol (Acetaminophen)  Motrin (Ibuprofen) or any fever reducing medication).  Also - Do not get tested prior to returning to work because once you have had a positive test the test can stay positive for more then a month in some cases.   You should wear a mask or cloth face covering over your nose and mouth if you must be around other people or animals, including pets (even at home). Try to stay at least 6 feet away from other people. This will protect the people around you.  Please continue good preventive care measures, including:  frequent hand-washing, avoid touching your face, cover coughs/sneezes, stay out of crowds and keep a 6 foot distance from others.  COVID-19 is a respiratory illness with symptoms that are similar to the flu. Symptoms are typically mild to moderate, but there have been cases of severe illness and death due to the virus.   The following symptoms may appear 2-14 days after exposure: . Fever . Cough . Shortness of breath or difficulty breathing . Chills . Repeated shaking with chills . Muscle pain . Headache . Sore throat . New loss of taste or smell . Fatigue . Congestion or runny nose . Nausea or vomiting . Diarrhea  Go to the nearest hospital ED for assessment if fever/cough/breathlessness are severe or illness seems like a threat to life.  It is vitally important that if you feel that you have an infection such as this virus or any other virus that you stay home and away from places where you may spread it to others.  You should avoid contact with people age 25 and older.   You can use medication such as A prescription inhaler called Albuterol MDI 90 mcg /actuation 2 puffs every 4 hours as needed for shortness of breath, wheezing, cough and prednisone dose pack.  You may also take acetaminophen (Tylenol) as needed for fever.  Reduce your risk of any infection by using the same precautions used for avoiding the common cold or flu:  .  Wash your hands often with soap and warm water for  at least 20 seconds.  If soap and water are not readily available, use an alcohol-based hand sanitizer with at least 60% alcohol.  . If coughing or sneezing, cover your mouth and nose by coughing or sneezing into the elbow areas of your shirt or coat, into a tissue or into your sleeve (not your hands). . Avoid shaking hands with others and consider head nods or verbal greetings only. . Avoid touching your eyes, nose, or mouth with unwashed hands.  . Avoid close contact with people who are sick. . Avoid places or events with large numbers of people in one location, like concerts or sporting events. . Carefully consider travel plans you have or are making. . If you are planning any travel outside or inside the [place], visit the CDC's Travelers' Health webpage for the latest health notices. . If you have some symptoms but not all symptoms, continue to monitor at home and seek medical attention if your symptoms worsen. . If you are having a medical emergency, call 911.  HOME CARE . Only take medications as instructed by your medical team. . Drink plenty of fluids and get plenty of rest. . A steam or ultrasonic humidifier can help if you have congestion.   GET HELP RIGHT AWAY IF YOU HAVE EMERGENCY WARNING SIGNS** FOR COVID-19. If you or someone is showing any of these signs seek emergency medical care immediately. Call 911 or proceed to your closest emergency facility if: . You develop worsening high fever. . Trouble breathing . Bluish lips or face . Persistent pain or pressure in the chest . New confusion . Inability to wake or stay awake . You cough up blood. . Your symptoms become more severe  **This list is not all possible symptoms. Contact your medical provider for any symptoms that are sever or concerning to you.  MAKE SURE YOU   Understand these instructions.  Will watch your condition.  Will get help right away if you are not doing well or get worse.  Your e-visit answers were  reviewed by a board certified advanced clinical practitioner to complete your personal care plan.  Depending on the condition, your plan could have included both over the counter or prescription medications.  If there is a problem please reply once you have received a response from your provider.  Your safety is important to [place].  If you have drug allergies check your prescription carefully.    You can use MyChart to ask questions about today's visit, request a non-urgent call back, or ask for a work or school excuse for 24 hours related to this e-Visit. If it has been greater than 24 hours you will need to follow up with your provider, or enter a new e-Visit to address those concerns. You will get an e-mail in the next two days asking about your experience.  I hope that your e-visit has been valuable and will speed your recovery. Thank you for using e-visits.   Approximately 5 minutes was spent documenting and reviewing patient's chart.

## 2020-05-09 ENCOUNTER — Other Ambulatory Visit: Payer: Self-pay | Admitting: Advanced Practice Midwife

## 2020-05-09 DIAGNOSIS — Z363 Encounter for antenatal screening for malformations: Secondary | ICD-10-CM

## 2020-05-09 DIAGNOSIS — Z3480 Encounter for supervision of other normal pregnancy, unspecified trimester: Secondary | ICD-10-CM

## 2020-05-10 ENCOUNTER — Encounter: Payer: Self-pay | Admitting: Advanced Practice Midwife

## 2020-05-10 ENCOUNTER — Ambulatory Visit (INDEPENDENT_AMBULATORY_CARE_PROVIDER_SITE_OTHER): Payer: BC Managed Care – PPO | Admitting: Advanced Practice Midwife

## 2020-05-10 ENCOUNTER — Ambulatory Visit (INDEPENDENT_AMBULATORY_CARE_PROVIDER_SITE_OTHER): Payer: BC Managed Care – PPO

## 2020-05-10 ENCOUNTER — Other Ambulatory Visit: Payer: Self-pay

## 2020-05-10 VITALS — BP 98/68 | HR 68 | Wt 171.0 lb

## 2020-05-10 DIAGNOSIS — Z363 Encounter for antenatal screening for malformations: Secondary | ICD-10-CM

## 2020-05-10 DIAGNOSIS — Z302 Encounter for sterilization: Secondary | ICD-10-CM

## 2020-05-10 DIAGNOSIS — Z331 Pregnant state, incidental: Secondary | ICD-10-CM | POA: Diagnosis not present

## 2020-05-10 DIAGNOSIS — F1290 Cannabis use, unspecified, uncomplicated: Secondary | ICD-10-CM

## 2020-05-10 DIAGNOSIS — Z3A18 18 weeks gestation of pregnancy: Secondary | ICD-10-CM | POA: Diagnosis not present

## 2020-05-10 DIAGNOSIS — Z3480 Encounter for supervision of other normal pregnancy, unspecified trimester: Secondary | ICD-10-CM

## 2020-05-10 DIAGNOSIS — Z1389 Encounter for screening for other disorder: Secondary | ICD-10-CM

## 2020-05-10 LAB — POCT URINALYSIS DIPSTICK OB
Blood, UA: NEGATIVE
Glucose, UA: NEGATIVE
Ketones, UA: NEGATIVE
Leukocytes, UA: NEGATIVE
Nitrite, UA: NEGATIVE
POC,PROTEIN,UA: NEGATIVE

## 2020-05-10 NOTE — Patient Instructions (Signed)
Allison Pratt, I greatly value your feedback.  If you receive a survey following your visit with [place] today, we appreciate you taking the time to fill it out.  Thanks, Derrill Memo, [place] [facility] ([place], [place] [address]) Entrance C, located off of [address] parking  Go to [email or web address] to register for FREE online childbirth classes  [place] Pediatricians/Family Doctors:  [facility] [place] [phone]                 [place] [phone] (usually not accepting new patients unless you have family there already, you are always welcome to call and ask)       [facility] Department [phone]       [facility] Pediatricians/Family Doctors:   [facility]: [phone]  [facility]: [phone]  [facility]: [place] Doctors:   [facility]: [facility]: [place]:  [place]: [phone]    Home Blood Pressure Monitoring for Patients   Your provider has recommended that you check your blood pressure (BP) at least once a week at home. If you do not have a blood pressure cuff at home, one will be provided for you. Contact your provider if you have not received your monitor within 1 week.   Helpful Tips for Accurate Home Blood Pressure Checks  . Don't smoke, exercise, or drink caffeine 30 minutes before checking your BP . Use the restroom before checking your BP (a full bladder can raise your pressure) . Relax in a comfortable upright chair . Feet on the ground . Left arm resting comfortably on a flat surface at the level of your heart . Legs uncrossed . Back supported . Sit quietly and don't talk . Place the cuff on your bare arm . Adjust snuggly, so that only  two fingertips can fit between your skin and the top of the cuff . Check 2 readings separated by at least one minute . Keep a log of your BP readings . For a visual, please reference this diagram: http://ccnc.care/bpdiagram  Provider Name: [facility]     Phone: [phone]  Zone 1: ALL CLEAR  Continue to monitor your symptoms:  . BP reading is less than 140 (top number) or less than 90 (bottom number)  . No right upper stomach pain . No headaches or seeing spots . No feeling nauseated or throwing up . No swelling in face and hands  Zone 2: CAUTION Call your doctor's office for any of the following:  . BP reading is greater than 140 (top number) or greater than 90 (bottom number)  . Stomach pain under your ribs in the middle or right side . Headaches or seeing spots . Feeling nauseated or throwing up . Swelling in face and hands  Zone 3: EMERGENCY  Seek immediate medical care if you have any of the following:  . BP reading is greater than160 (top number) or greater than 110 (bottom number) . Severe headaches not improving with Tylenol . Serious difficulty catching your breath . Any worsening symptoms from Zone 2     Second Trimester of Pregnancy The second trimester is from week 14 through week 27 (months 4 through 6). The second trimester is often a time when you feel your best.  Your body has adjusted to being pregnant, and you begin to feel better physically. Usually, morning sickness has lessened or quit completely, you may have more energy, and you may have an increase in appetite. The second trimester is also a time when the fetus is growing rapidly. At the end of the sixth month, the fetus is about 9 inches long and weighs about 1 pounds. You will likely begin to feel the baby move (quickening) between 16 and 20 weeks of pregnancy. Body changes during your second trimester Your body continues to go through many changes during your second trimester. The changes vary  from woman to woman.  Your weight will continue to increase. You will notice your lower abdomen bulging out.  You may begin to get stretch marks on your hips, abdomen, and breasts.  You may develop headaches that can be relieved by medicines. The medicines should be approved by your health care provider.  You may urinate more often because the fetus is pressing on your bladder.  You may develop or continue to have heartburn as a result of your pregnancy.  You may develop constipation because certain hormones are causing the muscles that push waste through your intestines to slow down.  You may develop hemorrhoids or swollen, bulging veins (varicose veins).  You may have back pain. This is caused by: ? Weight gain. ? Pregnancy hormones that are relaxing the joints in your pelvis. ? A shift in weight and the muscles that support your balance.  Your breasts will continue to grow and they will continue to become tender.  Your gums may bleed and may be sensitive to brushing and flossing.  Dark spots or blotches (chloasma, mask of pregnancy) may develop on your face. This will likely fade after the baby is born.  A dark line from your belly button to the pubic area (linea nigra) may appear. This will likely fade after the baby is born.  You may have changes in your hair. These can include thickening of your hair, rapid growth, and changes in texture. Some women also have hair loss during or after pregnancy, or hair that feels dry or thin. Your hair will most likely return to normal after your baby is born.  What to expect at prenatal visits During a routine prenatal visit:  You will be weighed to make sure you and the fetus are growing normally.  Your blood pressure will be taken.  Your abdomen will be measured to track your baby's growth.  The fetal heartbeat will be listened to.  Any test results from the previous visit will be discussed.  Your health care provider may ask  you:  How you are feeling.  If you are feeling the baby move.  If you have had any abnormal symptoms, such as leaking fluid, bleeding, severe headaches, or abdominal cramping.  If you are using any tobacco products, including cigarettes, chewing tobacco, and electronic cigarettes.  If you have any questions.  Other tests that may be performed during your second trimester include:  Blood tests that check for: ? Low iron levels (anemia). ? High blood sugar that affects pregnant women (gestational diabetes) between 14 and 28 weeks. ? Rh antibodies. This is to check for a protein on red blood cells (Rh factor).  Urine tests to check for infections, diabetes, or protein in the urine.  An ultrasound to confirm the proper growth and development of the baby.  An amniocentesis to check for possible genetic problems.  Fetal screens  for spina bifida and Down syndrome.  HIV (human immunodeficiency virus) testing. Routine prenatal testing includes screening for HIV, unless you choose not to have this test.  Follow these instructions at home: Medicines  Follow your health care provider's instructions regarding medicine use. Specific medicines may be either safe or unsafe to take during pregnancy.  Take a prenatal vitamin that contains at least 600 micrograms (mcg) of folic acid.  If you develop constipation, try taking a stool softener if your health care provider approves. Eating and drinking  Eat a balanced diet that includes fresh fruits and vegetables, whole grains, good sources of protein such as meat, eggs, or tofu, and low-fat dairy. Your health care provider will help you determine the amount of weight gain that is right for you.  Avoid raw meat and uncooked cheese. These carry germs that can cause birth defects in the baby.  If you have low calcium intake from food, talk to your health care provider about whether you should take a daily calcium supplement.  Limit foods that  are high in fat and processed sugars, such as fried and sweet foods.  To prevent constipation: ? Drink enough fluid to keep your urine clear or pale yellow. ? Eat foods that are high in fiber, such as fresh fruits and vegetables, whole grains, and beans. Activity  Exercise only as directed by your health care provider. Most women can continue their usual exercise routine during pregnancy. Try to exercise for 30 minutes at least 5 days a week. Stop exercising if you experience uterine contractions.  Avoid heavy lifting, wear low heel shoes, and practice good posture.  A sexual relationship may be continued unless your health care provider directs you otherwise. Relieving pain and discomfort  Wear a good support bra to prevent discomfort from breast tenderness.  Take warm sitz baths to soothe any pain or discomfort caused by hemorrhoids. Use hemorrhoid cream if your health care provider approves.  Rest with your legs elevated if you have leg cramps or low back pain.  If you develop varicose veins, wear support hose. Elevate your feet for 15 minutes, 3-4 times a day. Limit salt in your diet. Prenatal Care  Write down your questions. Take them to your prenatal visits.  Keep all your prenatal visits as told by your health care provider. This is important. Safety  Wear your seat belt at all times when driving.  Make a list of emergency phone numbers, including numbers for family, friends, the hospital, and police and fire departments. General instructions  Ask your health care provider for a referral to a local prenatal education class. Begin classes no later than the beginning of month 6 of your pregnancy.  Ask for help if you have counseling or nutritional needs during pregnancy. Your health care provider can offer advice or refer you to specialists for help with various needs.  Do not use hot tubs, steam rooms, or saunas.  Do not douche or use tampons or scented sanitary  pads.  Do not cross your legs for long periods of time.  Avoid cat litter boxes and soil used by cats. These carry germs that can cause birth defects in the baby and possibly loss of the fetus by miscarriage or stillbirth.  Avoid all smoking, herbs, alcohol, and unprescribed drugs. Chemicals in these products can affect the formation and growth of the baby.  Do not use any products that contain nicotine or tobacco, such as cigarettes and e-cigarettes. If you need help quitting,  ask your health care provider.  Visit your dentist if you have not gone yet during your pregnancy. Use a soft toothbrush to brush your teeth and be gentle when you floss. Contact a health care provider if:  You have dizziness.  You have mild pelvic cramps, pelvic pressure, or nagging pain in the abdominal area.  You have persistent nausea, vomiting, or diarrhea.  You have a bad smelling vaginal discharge.  You have pain when you urinate. Get help right away if:  You have a fever.  You are leaking fluid from your vagina.  You have spotting or bleeding from your vagina.  You have severe abdominal cramping or pain.  You have rapid weight gain or weight loss.  You have shortness of breath with chest pain.  You notice sudden or extreme swelling of your face, hands, ankles, feet, or legs.  You have not felt your baby move in over an hour.  You have severe headaches that do not go away when you take medicine.  You have vision changes. Summary  The second trimester is from week 14 through week 27 (months 4 through 6). It is also a time when the fetus is growing rapidly.  Your body goes through many changes during pregnancy. The changes vary from woman to woman.  Avoid all smoking, herbs, alcohol, and unprescribed drugs. These chemicals affect the formation and growth your baby.  Do not use any tobacco products, such as cigarettes, chewing tobacco, and e-cigarettes. If you need help quitting, ask your  health care provider.  Contact your health care provider if you have any questions. Keep all prenatal visits as told by your health care provider. This is important. This information is not intended to replace advice given to you by your health care provider. Make sure you discuss any questions you have with your health care provider. Document Released: 08/13/2001 Document Revised: 01/25/2016 Document Reviewed: 10/20/2012 Elsevier Interactive Patient Education  2017 [employer].

## 2020-05-10 NOTE — Progress Notes (Signed)
[place] 18+5 wks,breech,cx 3.4 cm,mult small fibroids,fundal fibroid 3.7 x 2.7 x 3.5 cm,posterior fundal placenta gr 0,normal ovaries,LVEICF 2.3 mm,fhr 142 bpm,EFW 241 g 30%,anatomy complete

## 2020-05-10 NOTE — Progress Notes (Signed)
   LOW-RISK PREGNANCY VISIT Patient name: Allison Pratt MRN [id number]  Date of birth: Jun 07, 1986 Chief Complaint:   Routine Prenatal Visit (anatomy scan)  History of Present Illness:   Allison Pratt is a 34 y.o. G41P0020 female at [redacted]w[redacted]d with an Estimated Date of Delivery: 10/06/20 being seen today for ongoing management of a low-risk pregnancy.  Today she reports feels well, starting to feel flutters; strongly considering BTL. Contractions: Not present. Vag. Bleeding: None.  Movement: Present. denies leaking of fluid. Review of Systems:   Pertinent items are noted in HPI Denies abnormal vaginal discharge w/ itching/odor/irritation, headaches, visual changes, shortness of breath, chest pain, abdominal pain, severe nausea/vomiting, or problems with urination or bowel movements unless otherwise stated above. Pertinent History Reviewed:  Reviewed past medical,surgical, social, obstetrical and family history.  Reviewed problem list, medications and allergies. Physical Assessment:   Vitals:   05/10/20 0927  BP: 98/68  Pulse: 68  Weight: 171 lb (77.6 kg)  Body mass index is 30.29 kg/m.        Physical Examination:   General appearance: Well appearing, and in no distress  Mental status: Alert, oriented to person, place, and time  Skin: Warm & dry  Cardiovascular: Normal heart rate noted  Respiratory: Normal respiratory effort, no distress  Abdomen: Soft, gravid, nontender  Pelvic: Cervical exam deferred         Extremities: Edema: None  Fetal Status: Fetal Heart Rate (bpm): 142 u/s   Movement: Present     Anatomy u/s: [place] 18+5 wks,breech,cx 3.4 cm,mult small fibroids,fundal fibroid 3.7 x 2.7 x 3.5 cm,posterior fundal placenta gr 0,normal ovaries,LVEICF 2.3 mm,fhr 142 bpm,EFW 241 g 30%,anatomy complete  Results for orders placed or performed in visit on 05/10/20 (from the past 24 hour(s))  POC Urinalysis Dipstick OB   Collection Time: 05/10/20  9:32 AM  Result Value Ref Range    Color, UA     Clarity, UA     Glucose, UA Negative Negative   Bilirubin, UA     Ketones, UA neg    Spec Grav, UA     Blood, UA neg    pH, UA     POC,PROTEIN,UA Negative Negative, Trace, Small (1+), Moderate (2+), Large (3+), 4+   Urobilinogen, UA     Nitrite, UA neg    Leukocytes, UA Negative Negative   Appearance     Odor      Assessment & Plan:  1) Low-risk pregnancy G3P0020 at [redacted]w[redacted]d with an Estimated Date of Delivery: 10/06/20   2) Considering ppBTL, reviewed benefits of LARCs; she would still like to sign papers at 28wks  3) Isolated EICF (2.48mm), given printed info and discussed; no f/u needed   Meds: No orders of the defined types were placed in this encounter.  Labs/procedures today: anatomy u/s; 2nd IT  Plan:  Continue routine obstetrical care   Reviewed: Preterm labor symptoms and general obstetric precautions including but not limited to vaginal bleeding, contractions, leaking of fluid and fetal movement were reviewed in detail with the patient.  All questions were answered. Didn't ask about home bp cuff. Check bp weekly, let us know if >140/90.   Follow-up: Return in about 4 weeks (around 06/07/2020) for [place], in person.  Orders Placed This Encounter  Procedures  . POC Urinalysis Dipstick OB   Myrtis Ser [facility] 05/10/2020 9:45 AM

## 2020-05-15 LAB — INTEGRATED 2
AFP MoM: 1.18
Alpha-Fetoprotein: 51 ng/mL
Crown Rump Length: 58.3 mm
DIA MoM: 0.72
DIA Value: 103.8 pg/mL
Estriol, Unconjugated: 2.09 ng/mL
Gest. Age on Collection Date: 12.1 weeks
Gestational Age: 18.1 weeks
Maternal Age at EDD: 33.9 yr
Nuchal Translucency (NT): 1.2 mm
Nuchal Translucency MoM: 0.92
Number of Fetuses: 1
PAPP-A MoM: 0.78
PAPP-A Value: 577.2 ng/mL
Test Results:: NEGATIVE
Weight: 172 [lb_av]
Weight: 172 [lb_av]
hCG MoM: 1.43
hCG Value: 32.4 IU/mL
uE3 MoM: 1.47

## 2020-05-17 ENCOUNTER — Other Ambulatory Visit: Payer: Self-pay

## 2020-05-17 ENCOUNTER — Inpatient Hospital Stay (HOSPITAL_COMMUNITY)
Admission: AD | Admit: 2020-05-17 | Discharge: 2020-05-17 | Discharge status: Against Medical Advice | Payer: BC Managed Care – PPO | Attending: Obstetrics and Gynecology | Admitting: Obstetrics and Gynecology

## 2020-05-17 DIAGNOSIS — O26892 Other specified pregnancy related conditions, second trimester: Secondary | ICD-10-CM | POA: Diagnosis present

## 2020-05-17 DIAGNOSIS — Z5321 Procedure and treatment not carried out due to patient leaving prior to being seen by health care provider: Secondary | ICD-10-CM | POA: Diagnosis not present

## 2020-06-08 ENCOUNTER — Ambulatory Visit (INDEPENDENT_AMBULATORY_CARE_PROVIDER_SITE_OTHER): Payer: BC Managed Care – PPO | Admitting: Advanced Practice Midwife

## 2020-06-08 ENCOUNTER — Encounter: Payer: Self-pay | Admitting: Advanced Practice Midwife

## 2020-06-08 ENCOUNTER — Other Ambulatory Visit: Payer: Self-pay

## 2020-06-08 VITALS — BP 93/61 | HR 96 | Wt 173.0 lb

## 2020-06-08 DIAGNOSIS — Z3480 Encounter for supervision of other normal pregnancy, unspecified trimester: Secondary | ICD-10-CM

## 2020-06-08 DIAGNOSIS — Z1389 Encounter for screening for other disorder: Secondary | ICD-10-CM

## 2020-06-08 DIAGNOSIS — Z331 Pregnant state, incidental: Secondary | ICD-10-CM

## 2020-06-08 LAB — POCT URINALYSIS DIPSTICK OB
Blood, UA: NEGATIVE
Glucose, UA: NEGATIVE
Nitrite, UA: NEGATIVE

## 2020-06-08 NOTE — Progress Notes (Signed)
   LOW-RISK PREGNANCY VISIT Patient name: Allison Pratt MRN [id number]  Date of birth: 03-Jan-1986 Chief Complaint:   Routine Prenatal Visit  History of Present Illness:   Allison Pratt is a 34 y.o. G74P0020 female at [redacted]w[redacted]d with an Estimated Date of Delivery: 10/06/20 being seen today for ongoing management of a low-risk pregnancy.  Today she reports no complaints. Contractions: Not present.  .  Movement: Present. denies leaking of fluid. Review of Systems:   Pertinent items are noted in HPI Denies abnormal vaginal discharge w/ itching/odor/irritation, headaches, visual changes, shortness of breath, chest pain, abdominal pain, severe nausea/vomiting, or problems with urination or bowel movements unless otherwise stated above. Pertinent History Reviewed:  Reviewed past medical,surgical, social, obstetrical and family history.  Reviewed problem list, medications and allergies. Physical Assessment:   Vitals:   06/08/20 1013  BP: 93/61  Pulse: 96  Weight: 173 lb (78.5 kg)  Body mass index is 30.65 kg/m.        Physical Examination:   General appearance: Well appearing, and in no distress  Mental status: Alert, oriented to person, place, and time  Skin: Warm & dry  Cardiovascular: Normal heart rate noted  Respiratory: Normal respiratory effort, no distress  Abdomen: Soft, gravid, nontender  Pelvic: Cervical exam deferred         Extremities: Edema: None  Fetal Status: Fetal Heart Rate (bpm): 150 Fundal Height: 22 cm Movement: Present    Chaperone: n/a    Results for orders placed or performed in visit on 06/08/20 (from the past 24 hour(s))  POC Urinalysis Dipstick OB   Collection Time: 06/08/20 10:21 AM  Result Value Ref Range   Color, UA     Clarity, UA     Glucose, UA Negative Negative   Bilirubin, UA     Ketones, UA large    Spec Grav, UA     Blood, UA neg    pH, UA     POC,PROTEIN,UA Trace Negative, Trace, Small (1+), Moderate (2+), Large (3+), 4+   Urobilinogen, UA      Nitrite, UA neg    Leukocytes, UA Moderate (2+) (A) Negative   Appearance     Odor      Assessment & Plan:  1) Low-risk pregnancy G3P0020 at [redacted]w[redacted]d with an Estimated Date of Delivery: 10/06/20   2) Prior CS, discussed TOLAC.  Paper given.  Will let us know   Meds: No orders of the defined types were placed in this encounter.  Labs/procedures today: none  Plan:  Continue routine obstetrical care  Next visit: prefers will be in person for PN2     Try Boost/ensure d/t poor appetite  Reviewed: Preterm labor symptoms and general obstetric precautions including but not limited to vaginal bleeding, contractions, leaking of fluid and fetal movement were reviewed in detail with the patient.  All questions were answered. Has home bp cuff. Check bp weekly, let us know if >140/90.   Follow-up: Return in about 4 weeks (around 07/06/2020) for PN2/LROB.  Orders Placed This Encounter  Procedures  . POC Urinalysis Dipstick OB   Christin Fudge DNP, CNM 06/08/2020 11:34 AM

## 2020-06-08 NOTE — Patient Instructions (Signed)

## 2020-06-20 ENCOUNTER — Encounter: Payer: Self-pay | Admitting: Obstetrics and Gynecology

## 2020-06-20 ENCOUNTER — Ambulatory Visit (INDEPENDENT_AMBULATORY_CARE_PROVIDER_SITE_OTHER): Payer: BC Managed Care – PPO | Admitting: Obstetrics and Gynecology

## 2020-06-20 ENCOUNTER — Other Ambulatory Visit (HOSPITAL_COMMUNITY)
Admission: RE | Admit: 2020-06-20 | Discharge: 2020-06-20 | Disposition: A | Discharge status: Home / Self Care | Payer: BC Managed Care – PPO | Source: Ambulatory Visit | Attending: Obstetrics and Gynecology | Admitting: Obstetrics and Gynecology

## 2020-06-20 VITALS — BP 99/64 | HR 76 | Wt 177.4 lb

## 2020-06-20 DIAGNOSIS — Z3482 Encounter for supervision of other normal pregnancy, second trimester: Secondary | ICD-10-CM

## 2020-06-20 DIAGNOSIS — Z3480 Encounter for supervision of other normal pregnancy, unspecified trimester: Secondary | ICD-10-CM | POA: Diagnosis not present

## 2020-06-20 DIAGNOSIS — Z113 Encounter for screening for infections with a predominantly sexual mode of transmission: Secondary | ICD-10-CM | POA: Diagnosis present

## 2020-06-20 DIAGNOSIS — Z202 Contact with and (suspected) exposure to infections with a predominantly sexual mode of transmission: Secondary | ICD-10-CM | POA: Insufficient documentation

## 2020-06-20 DIAGNOSIS — Z3A24 24 weeks gestation of pregnancy: Secondary | ICD-10-CM | POA: Diagnosis present

## 2020-06-20 DIAGNOSIS — O26892 Other specified pregnancy related conditions, second trimester: Secondary | ICD-10-CM

## 2020-06-20 DIAGNOSIS — Z331 Pregnant state, incidental: Secondary | ICD-10-CM

## 2020-06-20 DIAGNOSIS — Z1389 Encounter for screening for other disorder: Secondary | ICD-10-CM

## 2020-06-20 LAB — POCT URINALYSIS DIPSTICK OB
Blood, UA: NEGATIVE
Glucose, UA: NEGATIVE
Leukocytes, UA: NEGATIVE
Nitrite, UA: NEGATIVE
POC,PROTEIN,UA: NEGATIVE

## 2020-06-20 NOTE — Patient Instructions (Signed)

## 2020-06-20 NOTE — Progress Notes (Signed)
Subjective:  Allison Pratt is a 34 y.o. [id number] at [redacted]w[redacted]d being seen today for ongoing prenatal care.  She is currently monitored for the following issues for this low-risk pregnancy and has History of ectopic pregnancy; Encounter for supervision of normal pregnancy, antepartum; Marijuana use; Request for sterilization; and Exposure to sexually transmitted disease (STD) on their problem list.  Patient reports desire for STD testing.  Contractions: Not present. Vag. Bleeding: None.  Movement: Present. Denies leaking of fluid.   The following portions of the patient's history were reviewed and updated as appropriate: allergies, current medications, past family history, past medical history, past social history, past surgical history and problem list. Problem list updated.  Objective:   Vitals:   06/20/20 1526  BP: 99/64  Pulse: 76  Weight: 177 lb 6.4 oz (80.5 kg)    Fetal Status:     Movement: Present     General:  Alert, oriented and cooperative. Patient is in no acute distress.  Skin: Skin is warm and dry. No rash noted.   Cardiovascular: Normal heart rate noted  Respiratory: Normal respiratory effort, no problems with respiration noted  Abdomen: Soft, gravid, appropriate for gestational age. Pain/Pressure: Absent     Pelvic:  Cervical exam deferred      small folliculitis lesion noted on left, vaginal swab collected  Extremities: Normal range of motion.  Edema: None  Mental Status: Normal mood and affect. Normal behavior. Normal judgment and thought content.   Urinalysis:      Assessment and Plan:  Pregnancy: G4P1021 at [redacted]w[redacted]d  1. Pregnant state, incidental  - POC Urinalysis Dipstick OB  2. Supervision of other normal pregnancy, antepartum Glucola next OB visit - POC Urinalysis Dipstick OB - Cervicovaginal ancillary only( [place])  3. [redacted] weeks gestation of pregnancy  - POC Urinalysis Dipstick OB - Cervicovaginal ancillary only( [place])  4. Screening for  genitourinary condition  - POC Urinalysis Dipstick OB  5. Screen for STD (sexually transmitted disease)  - POC Urinalysis Dipstick OB - Cervicovaginal ancillary only( [place])  6. Exposure to sexually transmitted disease (STD) STD testing as per pt request Warm compresses and antibiotic onit for folliculitis  Preterm labor symptoms and general obstetric precautions including but not limited to vaginal bleeding, contractions, leaking of fluid and fetal movement were reviewed in detail with the patient. Please refer to After Visit Summary for other counseling recommendations.  No follow-ups on file.   Chancy Milroy, MD

## 2020-06-22 LAB — CERVICOVAGINAL ANCILLARY ONLY
Chlamydia: NEGATIVE
Comment: NEGATIVE
Comment: NEGATIVE
Comment: NORMAL
Neisseria Gonorrhea: NEGATIVE
Trichomonas: NEGATIVE

## 2020-07-05 ENCOUNTER — Other Ambulatory Visit: Payer: Self-pay

## 2020-07-05 ENCOUNTER — Ambulatory Visit (INDEPENDENT_AMBULATORY_CARE_PROVIDER_SITE_OTHER): Payer: BC Managed Care – PPO | Admitting: Advanced Practice Midwife

## 2020-07-05 ENCOUNTER — Other Ambulatory Visit: Payer: BC Managed Care – PPO

## 2020-07-05 VITALS — BP 94/62 | HR 71 | Wt 178.8 lb

## 2020-07-05 DIAGNOSIS — Z3A26 26 weeks gestation of pregnancy: Secondary | ICD-10-CM

## 2020-07-05 DIAGNOSIS — Z331 Pregnant state, incidental: Secondary | ICD-10-CM

## 2020-07-05 DIAGNOSIS — O34219 Maternal care for unspecified type scar from previous cesarean delivery: Secondary | ICD-10-CM | POA: Insufficient documentation

## 2020-07-05 DIAGNOSIS — Z3480 Encounter for supervision of other normal pregnancy, unspecified trimester: Secondary | ICD-10-CM

## 2020-07-05 DIAGNOSIS — Z3482 Encounter for supervision of other normal pregnancy, second trimester: Secondary | ICD-10-CM

## 2020-07-05 DIAGNOSIS — Z1389 Encounter for screening for other disorder: Secondary | ICD-10-CM

## 2020-07-05 LAB — POCT URINALYSIS DIPSTICK OB
Blood, UA: NEGATIVE
Glucose, UA: NEGATIVE
Nitrite, UA: NEGATIVE
POC,PROTEIN,UA: NEGATIVE

## 2020-07-05 NOTE — Progress Notes (Signed)
° °  LOW-RISK PREGNANCY VISIT Patient name: Allison Pratt MRN [id number]  Date of birth: 07/18/1986 Chief Complaint:   Routine Prenatal Visit  History of Present Illness:   Allison Pratt is a 34 y.o. G27P1021 female at [redacted]w[redacted]d with an Estimated Date of Delivery: 10/06/20 being seen today for ongoing management of a low-risk pregnancy.  Today she reports not eating/drinking well because she hasn't been feeling good in general; dizzy at times; leaning towards repeat C/S. Contractions: Not present. Vag. Bleeding: None.  Movement: Present. denies leaking of fluid. Review of Systems:   Pertinent items are noted in HPI Denies abnormal vaginal discharge w/ itching/odor/irritation, headaches, visual changes, shortness of breath, chest pain, abdominal pain, severe nausea/vomiting, or problems with urination or bowel movements unless otherwise stated above. Pertinent History Reviewed:  Reviewed past medical,surgical, social, obstetrical and family history.  Reviewed problem list, medications and allergies. Physical Assessment:   Vitals:   07/05/20 0911  BP: 94/62  Pulse: 71  Weight: 178 lb 12.8 oz (81.1 kg)  Body mass index is 31.67 kg/m.        Physical Examination:   General appearance: Well appearing, and in no distress  Mental status: Alert, oriented to person, place, and time  Skin: Warm & dry  Cardiovascular: Normal heart rate noted  Respiratory: Normal respiratory effort, no distress  Abdomen: Soft, gravid, nontender  Pelvic: Cervical exam deferred         Extremities: Edema: None  Fetal Status: Fetal Heart Rate (bpm): 147 Fundal Height: 26 cm Movement: Present    Results for orders placed or performed in visit on 07/05/20 (from the past 24 hour(s))  POC Urinalysis Dipstick OB   Collection Time: 07/05/20  9:08 AM  Result Value Ref Range   Color, UA     Clarity, UA     Glucose, UA Negative Negative   Bilirubin, UA     Ketones, UA mod    Spec Grav, UA     Blood, UA neg    pH, UA      POC,PROTEIN,UA Negative Negative, Trace, Small (1+), Moderate (2+), Large (3+), 4+   Urobilinogen, UA     Nitrite, UA neg    Leukocytes, UA Moderate (2+) (A) Negative   Appearance     Odor      Assessment & Plan:  1) Low-risk pregnancy [id number] at [redacted]w[redacted]d with an Estimated Date of Delivery: 10/06/20   2) Previous C/S, wants repeat with BTL  3) Desires BTL, sign papers at next visit  4) Previous +THC, will get a repeat UDS later in preg  5) Reports occ dizziness, discussed eating small amts of protein throughout the day, examples given   Meds: No orders of the defined types were placed in this encounter.  Labs/procedures today: PN2, given info on Tdap and flu vaccines  Plan:  Continue routine obstetrical care   Reviewed: Preterm labor symptoms and general obstetric precautions including but not limited to vaginal bleeding, contractions, leaking of fluid and fetal movement were reviewed in detail with the patient.  All questions were answered. Has home bp cuff. Check bp weekly, let us know if >140/90.   Follow-up: Return in about 3 weeks (around 07/26/2020) for [facility], in person.  Orders Placed This Encounter  Procedures   POC Urinalysis Dipstick OB   Myrtis Ser [facility] 07/05/2020 9:36 AM

## 2020-07-06 ENCOUNTER — Other Ambulatory Visit: Payer: Self-pay | Admitting: Women's Health

## 2020-07-06 LAB — CBC
Hematocrit: 30.7 % — ABNORMAL LOW (ref 34.0–46.6)
Hemoglobin: 10.2 g/dL — ABNORMAL LOW (ref 11.1–15.9)
MCH: 32.3 pg (ref 26.6–33.0)
MCHC: 33.2 g/dL (ref 31.5–35.7)
MCV: 97 fL (ref 79–97)
Platelets: 221 10*3/uL (ref 150–450)
RBC: 3.16 x10E6/uL — ABNORMAL LOW (ref 3.77–5.28)
RDW: 13.5 % (ref 11.7–15.4)
WBC: 7.1 10*3/uL (ref 3.4–10.8)

## 2020-07-06 LAB — GLUCOSE TOLERANCE, 2 HOURS W/ 1HR
Glucose, 1 hour: 147 mg/dL (ref 65–179)
Glucose, 2 hour: 89 mg/dL (ref 65–152)
Glucose, Fasting: 80 mg/dL (ref 65–91)

## 2020-07-06 LAB — ANTIBODY SCREEN: Antibody Screen: NEGATIVE

## 2020-07-06 LAB — RPR: RPR Ser Ql: NONREACTIVE

## 2020-07-06 LAB — HIV ANTIBODY (ROUTINE TESTING W REFLEX): HIV Screen 4th Generation wRfx: NONREACTIVE

## 2020-07-06 MED ORDER — FERROUS SULFATE 325 (65 FE) MG PO TABS: 325.0000 mg | ORAL_TABLET | Freq: Two times a day (BID) | ORAL | 3 refills | Status: DC

## 2020-07-26 ENCOUNTER — Ambulatory Visit (INDEPENDENT_AMBULATORY_CARE_PROVIDER_SITE_OTHER): Payer: BC Managed Care – PPO | Admitting: Women's Health

## 2020-07-26 ENCOUNTER — Encounter: Payer: BC Managed Care – PPO | Admitting: Women's Health

## 2020-07-26 ENCOUNTER — Other Ambulatory Visit: Payer: Self-pay

## 2020-07-26 ENCOUNTER — Encounter: Payer: Self-pay | Admitting: Women's Health

## 2020-07-26 VITALS — BP 109/74 | HR 90 | Wt 182.0 lb

## 2020-07-26 DIAGNOSIS — Z3483 Encounter for supervision of other normal pregnancy, third trimester: Secondary | ICD-10-CM

## 2020-07-26 DIAGNOSIS — Z23 Encounter for immunization: Secondary | ICD-10-CM | POA: Diagnosis not present

## 2020-07-26 DIAGNOSIS — Z3A29 29 weeks gestation of pregnancy: Secondary | ICD-10-CM

## 2020-07-26 DIAGNOSIS — Z1389 Encounter for screening for other disorder: Secondary | ICD-10-CM

## 2020-07-26 DIAGNOSIS — F1290 Cannabis use, unspecified, uncomplicated: Secondary | ICD-10-CM

## 2020-07-26 DIAGNOSIS — Z331 Pregnant state, incidental: Secondary | ICD-10-CM

## 2020-07-26 DIAGNOSIS — Z302 Encounter for sterilization: Secondary | ICD-10-CM

## 2020-07-26 DIAGNOSIS — Z3480 Encounter for supervision of other normal pregnancy, unspecified trimester: Secondary | ICD-10-CM

## 2020-07-26 DIAGNOSIS — O34219 Maternal care for unspecified type scar from previous cesarean delivery: Secondary | ICD-10-CM

## 2020-07-26 LAB — POCT URINALYSIS DIPSTICK OB
Blood, UA: NEGATIVE
Glucose, UA: NEGATIVE
Ketones, UA: NEGATIVE
Leukocytes, UA: NEGATIVE
Nitrite, UA: NEGATIVE

## 2020-07-26 NOTE — Progress Notes (Signed)
LOW-RISK PREGNANCY VISIT Patient name: Allison Pratt MRN [id number]  Date of birth: 1986/02/03 Chief Complaint:   Routine Prenatal Visit  History of Present Illness:   Allison Pratt is a 34 y.o. G48P1021 female at [redacted]w[redacted]d with an Estimated Date of Delivery: 10/06/20 being seen today for ongoing management of a low-risk pregnancy.  Depression screen [facility] 2/9 07/05/2020 03/29/2020 02/02/2020  Decreased Interest 0 1 1  Down, Depressed, Hopeless 2 0 0  PHQ - 2 Score 2 1 1   Altered sleeping 0 0 -  Tired, decreased energy 0 2 -  Change in appetite 0 1 -  Feeling bad or failure about yourself  1 0 -  Trouble concentrating 0 0 -  Moving slowly or fidgety/restless 0 0 -  Suicidal thoughts 0 0 -  PHQ-9 Score 3 4 -    Today she reports no complaints. Wants RCS w/ BTL.  Contractions: Not present.  .  Movement: Present. denies leaking of fluid. Review of Systems:   Pertinent items are noted in HPI Denies abnormal vaginal discharge w/ itching/odor/irritation, headaches, visual changes, shortness of breath, chest pain, abdominal pain, severe nausea/vomiting, or problems with urination or bowel movements unless otherwise stated above. Pertinent History Reviewed:  Reviewed past medical,surgical, social, obstetrical and family history.  Reviewed problem list, medications and allergies. Physical Assessment:   Vitals:   07/26/20 1142  BP: 109/74  Pulse: 90  Weight: 182 lb (82.6 kg)  Body mass index is 32.24 kg/m.        Physical Examination:   General appearance: Well appearing, and in no distress  Mental status: Alert, oriented to person, place, and time  Skin: Warm & dry  Cardiovascular: Normal heart rate noted  Respiratory: Normal respiratory effort, no distress  Abdomen: Soft, gravid, nontender  Pelvic: Cervical exam deferred         Extremities: Edema: None  Fetal Status: Fetal Heart Rate (bpm): 141 Fundal Height: 27 cm Movement: Present    Chaperone: N/A   Results for orders placed  or performed in visit on 07/26/20 (from the past 24 hour(s))  POC Urinalysis Dipstick OB   Collection Time: 07/26/20 11:50 AM  Result Value Ref Range   Color, UA     Clarity, UA     Glucose, UA Negative Negative   Bilirubin, UA     Ketones, UA neg    Spec Grav, UA     Blood, UA neg    pH, UA     POC,PROTEIN,UA Trace Negative, Trace, Small (1+), Moderate (2+), Large (3+), 4+   Urobilinogen, UA     Nitrite, UA neg    Leukocytes, UA Negative Negative   Appearance     Odor      Assessment & Plan:  1) Low-risk pregnancy [id number] at [redacted]w[redacted]d with an Estimated Date of Delivery: 10/06/20   2) Prev c/s, wants RCS  3) Wants BTL> reviewed risks/benefits, LARCs just as effective, consent signed today    Meds: No orders of the defined types were placed in this encounter.  Labs/procedures today: tdap, flu shot  Plan:  Continue routine obstetrical care  Next visit: prefers in person    Reviewed: Preterm labor symptoms and general obstetric precautions including but not limited to vaginal bleeding, contractions, leaking of fluid and fetal movement were reviewed in detail with the patient.  All questions were answered.   Follow-up: Return in about 2 weeks (around 08/09/2020) for LROB, CNM, Sign BTL consent today.  No future appointments.  Orders Placed This Encounter  Procedures  . POC Urinalysis Dipstick OB   Roma Schanz CNM, [facility] 07/26/2020 12:12 PM

## 2020-07-26 NOTE — Addendum Note (Signed)
Addended by: Diona Fanti A on: 07/26/2020 12:21 PM   Modules accepted: Orders

## 2020-07-26 NOTE — Patient Instructions (Signed)
Allison Pratt, I greatly value your feedback.  If you receive a survey following your visit with [place] today, we appreciate you taking the time to fill it out.  Thanks, Knute Neu, CNM, WHNP-BC   [facility] [place] [facility] ([place], [place] [address]) Entrance C, located off of [address] parking  Go to [email or web address] to register for FREE online childbirth classes   Call the office [phone]) or go to [facility] if:  You begin to have strong, frequent contractions  Your water breaks.  Sometimes it is a big gush of fluid, sometimes it is just a trickle that keeps getting your panties wet or running down your legs  You have vaginal bleeding.  It is normal to have a small amount of spotting if your cervix was checked.   You don't feel your baby moving like normal.  If you don't, get you something to eat and drink and lay down and focus on feeling your baby move.  You should feel at least 10 movements in 2 hours.  If you don't, you should call the office or go to [facility].    Tdap Vaccine  It is recommended that you get the Tdap vaccine during the third trimester of EACH pregnancy to help protect your baby from getting pertussis (whooping cough)  27-36 weeks is the BEST time to do this so that you can pass the protection on to your baby. During pregnancy is better than after pregnancy, but if you are unable to get it during pregnancy it will be offered at the hospital.   You can get this vaccine with [place], at the health department, your family doctor, or some local pharmacies  Everyone who will be around your baby should also be up-to-date on their vaccines before the baby comes. Adults (who are not pregnant) only need 1 dose of Tdap during adulthood.   [place] Pediatricians/Family Doctors:  [facility] [phone]                 [place]  [phone] (usually not accepting new patients unless you have family there already, you are always welcome to call and ask)       [facility] Department [phone]       [facility] Pediatricians/Family Doctors:   [facility]: [phone]  [facility]: [phone]  [facility]: [place] Doctors:   [facility]: [facility]: [place]:  [place]: [phone]   Home Blood Pressure Monitoring for Patients   Your provider has recommended that you check your blood pressure (BP) at least once a week at home. If you do not have a blood pressure cuff at home, one will be provided for you. Contact your provider if you have not received your monitor within 1 week.   Helpful Tips for Accurate Home Blood Pressure Checks  . Don't smoke, exercise, or drink caffeine 30 minutes before checking your BP . Use the restroom before checking your BP (a full bladder can raise your pressure) . Relax in a comfortable upright chair . Feet on the ground . Left arm resting comfortably on a flat surface at the level of your heart . Legs uncrossed . Back supported . Sit quietly and don't talk . Place the cuff on your  bare arm . Adjust snuggly, so that only two fingertips can fit between your skin and the top of the cuff . Check 2 readings separated by at least one minute . Keep a log of your BP readings . For a visual, please reference this diagram: http://ccnc.care/bpdiagram  Provider Name: [facility]     Phone: [phone]  Zone 1: ALL CLEAR  Continue to monitor your symptoms:  . BP reading is less than 140 (top number) or less than 90 (bottom number)  . No right upper stomach pain . No headaches or seeing spots . No feeling nauseated or throwing up . No swelling in face and hands  Zone 2: CAUTION Call your  doctor's office for any of the following:  . BP reading is greater than 140 (top number) or greater than 90 (bottom number)  . Stomach pain under your ribs in the middle or right side . Headaches or seeing spots . Feeling nauseated or throwing up . Swelling in face and hands  Zone 3: EMERGENCY  Seek immediate medical care if you have any of the following:  . BP reading is greater than160 (top number) or greater than 110 (bottom number) . Severe headaches not improving with Tylenol . Serious difficulty catching your breath . Any worsening symptoms from Zone 2   Third Trimester of Pregnancy The third trimester is from week 29 through week 42, months 7 through 9. The third trimester is a time when the fetus is growing rapidly. At the end of the ninth month, the fetus is about 20 inches in length and weighs 6-10 pounds.  BODY CHANGES Your body goes through many changes during pregnancy. The changes vary from woman to woman.   Your weight will continue to increase. You can expect to gain 25-35 pounds (11-16 kg) by the end of the pregnancy.  You may begin to get stretch marks on your hips, abdomen, and breasts.  You may urinate more often because the fetus is moving lower into your pelvis and pressing on your bladder.  You may develop or continue to have heartburn as a result of your pregnancy.  You may develop constipation because certain hormones are causing the muscles that push waste through your intestines to slow down.  You may develop hemorrhoids or swollen, bulging veins (varicose veins).  You may have pelvic pain because of the weight gain and pregnancy hormones relaxing your joints between the bones in your pelvis. Backaches may result from overexertion of the muscles supporting your posture.  You may have changes in your hair. These can include thickening of your hair, rapid growth, and changes in texture. Some women also have hair loss during or after pregnancy, or hair that  feels dry or thin. Your hair will most likely return to normal after your baby is born.  Your breasts will continue to grow and be tender. A yellow discharge may leak from your breasts called colostrum.  Your belly button may stick out.  You may feel short of breath because of your expanding uterus.  You may notice the fetus "dropping," or moving lower in your abdomen.  You may have a bloody mucus discharge. This usually occurs a few days to a week before labor begins.  Your cervix becomes thin and soft (effaced) near your due date. WHAT TO EXPECT AT YOUR PRENATAL EXAMS  You will have prenatal exams every 2 weeks until week 36. Then, you will have weekly prenatal exams. During a routine prenatal visit:  You will be weighed to make sure you and the fetus are growing normally.  Your blood pressure is taken.  Your abdomen will be measured to track your baby's growth.  The fetal heartbeat will be listened to.  Any test results from the previous visit will be discussed.  You may have a cervical check near your due date to see if you have effaced. At around 36 weeks, your caregiver will check your cervix. At the same time, your caregiver will also perform a test on the secretions of the vaginal tissue. This test is to determine if a type of bacteria, Group B streptococcus, is present. Your caregiver will explain this further. Your caregiver may ask you:  What your birth plan is.  How you are feeling.  If you are feeling the baby move.  If you have had any abnormal symptoms, such as leaking fluid, bleeding, severe headaches, or abdominal cramping.  If you have any questions. Other tests or screenings that may be performed during your third trimester include:  Blood tests that check for low iron levels (anemia).  Fetal testing to check the health, activity level, and growth of the fetus. Testing is done if you have certain medical conditions or if there are problems during the  pregnancy. FALSE LABOR You may feel small, irregular contractions that eventually go away. These are called Braxton Hicks contractions, or false labor. Contractions may last for hours, days, or even weeks before true labor sets in. If contractions come at regular intervals, intensify, or become painful, it is best to be seen by your caregiver.  SIGNS OF LABOR   Menstrual-like cramps.  Contractions that are 5 minutes apart or less.  Contractions that start on the top of the uterus and spread down to the lower abdomen and back.  A sense of increased pelvic pressure or back pain.  A watery or bloody mucus discharge that comes from the vagina. If you have any of these signs before the 37th week of pregnancy, call your caregiver right away. You need to go to the hospital to get checked immediately. HOME CARE INSTRUCTIONS   Avoid all smoking, herbs, alcohol, and unprescribed drugs. These chemicals affect the formation and growth of the baby.  Follow your caregiver's instructions regarding medicine use. There are medicines that are either safe or unsafe to take during pregnancy.  Exercise only as directed by your caregiver. Experiencing uterine cramps is a good sign to stop exercising.  Continue to eat regular, healthy meals.  Wear a good support bra for breast tenderness.  Do not use hot tubs, steam rooms, or saunas.  Wear your seat belt at all times when driving.  Avoid raw meat, uncooked cheese, cat litter boxes, and soil used by cats. These carry germs that can cause birth defects in the baby.  Take your prenatal vitamins.  Try taking a stool softener (if your caregiver approves) if you develop constipation. Eat more high-fiber foods, such as fresh vegetables or fruit and whole grains. Drink plenty of fluids to keep your urine clear or pale yellow.  Take warm sitz baths to soothe any pain or discomfort caused by hemorrhoids. Use hemorrhoid cream if your caregiver approves.  If you  develop varicose veins, wear support hose. Elevate your feet for 15 minutes, 3-4 times a day. Limit salt in your diet.  Avoid heavy lifting, wear low heal shoes, and practice good posture.  Rest a lot with your legs elevated if you have leg cramps or low  back pain.  Visit your dentist if you have not gone during your pregnancy. Use a soft toothbrush to brush your teeth and be gentle when you floss.  A sexual relationship may be continued unless your caregiver directs you otherwise.  Do not travel far distances unless it is absolutely necessary and only with the approval of your caregiver.  Take prenatal classes to understand, practice, and ask questions about the labor and delivery.  Make a trial run to the hospital.  Pack your hospital bag.  Prepare the baby's nursery.  Continue to go to all your prenatal visits as directed by your caregiver. SEEK MEDICAL CARE IF:  You are unsure if you are in labor or if your water has broken.  You have dizziness.  You have mild pelvic cramps, pelvic pressure, or nagging pain in your abdominal area.  You have persistent nausea, vomiting, or diarrhea.  You have a bad smelling vaginal discharge.  You have pain with urination. SEEK IMMEDIATE MEDICAL CARE IF:   You have a fever.  You are leaking fluid from your vagina.  You have spotting or bleeding from your vagina.  You have severe abdominal cramping or pain.  You have rapid weight loss or gain.  You have shortness of breath with chest pain.  You notice sudden or extreme swelling of your face, hands, ankles, feet, or legs.  You have not felt your baby move in over an hour.  You have severe headaches that do not go away with medicine.  You have vision changes. Document Released: 08/13/2001 Document Revised: 08/24/2013 Document Reviewed: 10/20/2012 [facility] Patient Information 2015 [place], [place]. This information is not intended to replace advice given to you by your health  care provider. Make sure you discuss any questions you have with your health care provider.

## 2020-08-09 ENCOUNTER — Encounter: Payer: Self-pay | Admitting: Women's Health

## 2020-08-09 ENCOUNTER — Other Ambulatory Visit: Payer: Self-pay

## 2020-08-09 ENCOUNTER — Ambulatory Visit (INDEPENDENT_AMBULATORY_CARE_PROVIDER_SITE_OTHER): Payer: BC Managed Care – PPO | Admitting: Women's Health

## 2020-08-09 VITALS — BP 105/61 | HR 84 | Wt 184.2 lb

## 2020-08-09 DIAGNOSIS — Z331 Pregnant state, incidental: Secondary | ICD-10-CM

## 2020-08-09 DIAGNOSIS — Z3483 Encounter for supervision of other normal pregnancy, third trimester: Secondary | ICD-10-CM

## 2020-08-09 DIAGNOSIS — Z3A31 31 weeks gestation of pregnancy: Secondary | ICD-10-CM

## 2020-08-09 DIAGNOSIS — Z1389 Encounter for screening for other disorder: Secondary | ICD-10-CM

## 2020-08-09 LAB — POCT URINALYSIS DIPSTICK OB
Blood, UA: NEGATIVE
Glucose, UA: NEGATIVE
Ketones, UA: NEGATIVE
Nitrite, UA: NEGATIVE

## 2020-08-09 NOTE — Progress Notes (Signed)
LOW-RISK PREGNANCY VISIT Patient name: Allison Pratt MRN [id number]  Date of birth: March 31, 1986 Chief Complaint:   Routine Prenatal Visit  History of Present Illness:   Allison Pratt is a 34 y.o. G65P1021 female at [redacted]w[redacted]d with an Estimated Date of Delivery: 10/06/20 being seen today for ongoing management of a low-risk pregnancy.  Depression screen [facility] 2/9 07/05/2020 03/29/2020 02/02/2020  Decreased Interest 0 1 1  Down, Depressed, Hopeless 2 0 0  PHQ - 2 Score 2 1 1   Altered sleeping 0 0 -  Tired, decreased energy 0 2 -  Change in appetite 0 1 -  Feeling bad or failure about yourself  1 0 -  Trouble concentrating 0 0 -  Moving slowly or fidgety/restless 0 0 -  Suicidal thoughts 0 0 -  PHQ-9 Score 3 4 -    Today she reports no complaints. Contractions: Not present. Vag. Bleeding: None.  Movement: Present. denies leaking of fluid. Review of Systems:   Pertinent items are noted in HPI Denies abnormal vaginal discharge w/ itching/odor/irritation, headaches, visual changes, shortness of breath, chest pain, abdominal pain, severe nausea/vomiting, or problems with urination or bowel movements unless otherwise stated above. Pertinent History Reviewed:  Reviewed past medical,surgical, social, obstetrical and family history.  Reviewed problem list, medications and allergies. Physical Assessment:   Vitals:   08/09/20 1206  BP: 105/61  Pulse: 84  Weight: 184 lb 3.2 oz (83.6 kg)  Body mass index is 32.63 kg/m.        Physical Examination:   General appearance: Well appearing, and in no distress  Mental status: Alert, oriented to person, place, and time  Skin: Warm & dry  Cardiovascular: Normal heart rate noted  Respiratory: Normal respiratory effort, no distress  Abdomen: Soft, gravid, nontender  Pelvic: Cervical exam deferred         Extremities: Edema: None  Fetal Status: Fetal Heart Rate (bpm): 136 Fundal Height: 29 cm Movement: Present    Chaperone: N/A   Results for orders  placed or performed in visit on 08/09/20 (from the past 24 hour(s))  POC Urinalysis Dipstick OB   Collection Time: 08/09/20 12:04 PM  Result Value Ref Range   Color, UA     Clarity, UA     Glucose, UA Negative Negative   Bilirubin, UA     Ketones, UA neg    Spec Grav, UA     Blood, UA neg    pH, UA     POC,PROTEIN,UA Trace Negative, Trace, Small (1+), Moderate (2+), Large (3+), 4+   Urobilinogen, UA     Nitrite, UA neg    Leukocytes, UA Small (1+) (A) Negative   Appearance     Odor      Assessment & Plan:  1) Low-risk pregnancy [id number] at [redacted]w[redacted]d with an Estimated Date of Delivery: 10/06/20   2) Prev c/s> for RCS w/ BTL, schedule next visit   Meds: No orders of the defined types were placed in this encounter.  Labs/procedures today: none  Plan:  Continue routine obstetrical care  Next visit: prefers in person    Reviewed: Preterm labor symptoms and general obstetric precautions including but not limited to vaginal bleeding, contractions, leaking of fluid and fetal movement were reviewed in detail with the patient.  All questions were answered. Has home bp cuff. Check bp weekly, let us know if >140/90.   Follow-up: Return in about 2 weeks (around 08/23/2020) for LROB, MD only, in person.  No future appointments.  Orders Placed This Encounter  Procedures  . POC Urinalysis Dipstick OB   Roma Schanz CNM, [facility] 08/09/2020 12:21 PM

## 2020-08-09 NOTE — Patient Instructions (Signed)
Lois L Tashiro, I greatly value your feedback.  If you receive a survey following your visit with Allison Pratt today, we appreciate you taking the time to fill it out.  Thanks, Knute Neu, CNM, WHNP-BC  [facility] [place] at [facility] ([place], [place] [address]) Entrance C, located off of [place] parking   Go to [email or web address] to register for FREE online childbirth classes    Call the office [phone]) or go to [facility] if:  You begin to have strong, frequent contractions  Your water breaks.  Sometimes it is a big gush of fluid, sometimes it is just a trickle that keeps getting your panties wet or running down your legs  You have vaginal bleeding.  It is normal to have a small amount of spotting if your cervix was checked.   You don't feel your baby moving like normal.  If you don't, get you something to eat and drink and lay down and focus on feeling your baby move.  You should feel at least 10 movements in 2 hours.  If you don't, you should call the office or go to [facility].   Call the office [phone]) or go to [facility] hospital for these signs of pre-eclampsia:  Severe headache that does not go away with Tylenol  Visual changes- seeing spots, double, blurred vision  Pain under your right breast or upper abdomen that does not go away with Tums or heartburn medicine  Nausea and/or vomiting  Severe swelling in your hands, feet, and face    Home Blood Pressure Monitoring for Patients   Your provider has recommended that you check your blood pressure (BP) at least once a week at home. If you do not have a blood pressure cuff at home, one will be provided for you. Contact your provider if you have not received your monitor within 1 week.   Helpful Tips for Accurate Home Blood Pressure Checks  . Don't smoke, exercise, or drink caffeine 30 minutes before checking your BP . Use the restroom before checking your BP (a full  bladder can raise your pressure) . Relax in a comfortable upright chair . Feet on the ground . Left arm resting comfortably on a flat surface at the level of your heart . Legs uncrossed . Back supported . Sit quietly and don't talk . Place the cuff on your bare arm . Adjust snuggly, so that only two fingertips can fit between your skin and the top of the cuff . Check 2 readings separated by at least one minute . Keep a log of your BP readings . For a visual, please reference this diagram: http://ccnc.care/bpdiagram  Provider Name: [facility]     Phone: [phone]  Zone 1: ALL CLEAR  Continue to monitor your symptoms:  . BP reading is less than 140 (top number) or less than 90 (bottom number)  . No right upper stomach pain . No headaches or seeing spots . No feeling nauseated or throwing up . No swelling in face and hands  Zone 2: CAUTION Call your doctor's office for any of the following:  . BP reading is greater than 140 (top number) or greater than 90 (bottom number)  . Stomach pain under your ribs in the middle or right side . Headaches or seeing spots . Feeling nauseated or throwing up . Swelling in face and hands  Zone 3: EMERGENCY  Seek immediate medical care if you have any of  the following:  . BP reading is greater than160 (top number) or greater than 110 (bottom number) . Severe headaches not improving with Tylenol . Serious difficulty catching your breath . Any worsening symptoms from Zone 2  Preterm Labor and Birth Information  The normal length of a pregnancy is 39-41 weeks. Preterm labor is when labor starts before 37 completed weeks of pregnancy. What are the risk factors for preterm labor? Preterm labor is more likely to occur in women who:  Have certain infections during pregnancy such as a bladder infection, sexually transmitted infection, or infection inside the uterus (chorioamnionitis).  Have a shorter-than-normal cervix.  Have gone into  preterm labor before.  Have had surgery on their cervix.  Are younger than age 54 or older than age 25.  Are African American.  Are pregnant with twins or multiple babies (multiple gestation).  Take street drugs or smoke while pregnant.  Do not gain enough weight while pregnant.  Became pregnant shortly after having been pregnant. What are the symptoms of preterm labor? Symptoms of preterm labor include:  Cramps similar to those that can happen during a menstrual period. The cramps may happen with diarrhea.  Pain in the abdomen or lower back.  Regular uterine contractions that may feel like tightening of the abdomen.  A feeling of increased pressure in the pelvis.  Increased watery or bloody mucus discharge from the vagina.  Water breaking (ruptured amniotic sac). Why is it important to recognize signs of preterm labor? It is important to recognize signs of preterm labor because babies who are born prematurely may not be fully developed. This can put them at an increased risk for:  Long-term (chronic) heart and lung problems.  Difficulty immediately after birth with regulating body systems, including blood sugar, body temperature, heart rate, and breathing rate.  Bleeding in the brain.  Cerebral palsy.  Learning difficulties.  Death. These risks are highest for babies who are born before 48 weeks of pregnancy. How is preterm labor treated? Treatment depends on the length of your pregnancy, your condition, and the health of your baby. It may involve: 1. Having a stitch (suture) placed in your cervix to prevent your cervix from opening too early (cerclage). 2. Taking or being given medicines, such as: ? Hormone medicines. These may be given early in pregnancy to help support the pregnancy. ? Medicine to stop contractions. ? Medicines to help mature the baby's lungs. These may be prescribed if the risk of delivery is high. ? Medicines to prevent your baby from  developing cerebral palsy. If the labor happens before 34 weeks of pregnancy, you may need to stay in the hospital. What should I do if I think I am in preterm labor? If you think that you are going into preterm labor, call your health care provider right away. How can I prevent preterm labor in future pregnancies? To increase your chance of having a full-term pregnancy:  Do not use any tobacco products, such as cigarettes, chewing tobacco, and e-cigarettes. If you need help quitting, ask your health care provider.  Do not use street drugs or medicines that have not been prescribed to you during your pregnancy.  Talk with your health care provider before taking any herbal supplements, even if you have been taking them regularly.  Make sure you gain a healthy amount of weight during your pregnancy.  Watch for infection. If you think that you might have an infection, get it checked right away.  Make sure to  tell your health care provider if you have gone into preterm labor before. This information is not intended to replace advice given to you by your health care provider. Make sure you discuss any questions you have with your health care provider. Document Revised: 12/11/2018 Document Reviewed: 01/10/2016 Elsevier Patient Education  [employer].

## 2020-08-22 ENCOUNTER — Other Ambulatory Visit: Payer: Self-pay

## 2020-08-22 ENCOUNTER — Ambulatory Visit (INDEPENDENT_AMBULATORY_CARE_PROVIDER_SITE_OTHER): Payer: BC Managed Care – PPO | Admitting: Women's Health

## 2020-08-22 ENCOUNTER — Encounter: Payer: Self-pay | Admitting: Women's Health

## 2020-08-22 VITALS — BP 92/62 | HR 94 | Wt 186.5 lb

## 2020-08-22 DIAGNOSIS — Z3480 Encounter for supervision of other normal pregnancy, unspecified trimester: Secondary | ICD-10-CM

## 2020-08-22 DIAGNOSIS — Z3483 Encounter for supervision of other normal pregnancy, third trimester: Secondary | ICD-10-CM

## 2020-08-22 DIAGNOSIS — Z3A33 33 weeks gestation of pregnancy: Secondary | ICD-10-CM

## 2020-08-22 DIAGNOSIS — Z1389 Encounter for screening for other disorder: Secondary | ICD-10-CM

## 2020-08-22 DIAGNOSIS — Z331 Pregnant state, incidental: Secondary | ICD-10-CM

## 2020-08-22 DIAGNOSIS — O34219 Maternal care for unspecified type scar from previous cesarean delivery: Secondary | ICD-10-CM

## 2020-08-22 LAB — POCT URINALYSIS DIPSTICK OB
Blood, UA: NEGATIVE
Glucose, UA: NEGATIVE
Ketones, UA: NEGATIVE
Nitrite, UA: NEGATIVE
POC,PROTEIN,UA: NEGATIVE

## 2020-08-22 NOTE — Patient Instructions (Signed)
Mckaylee L Ethridge, I greatly value your feedback.  If you receive a survey following your visit with [place] today, we appreciate you taking the time to fill it out.  Thanks, Knute Neu, CNM, WHNP-BC  Women[facility] & [place] at [facility] ([place], [place] [address]) Entrance C, located off of [place] parking   Go to [email or web address] to register for FREE online childbirth classes    Call the office [phone]) or go to [facility] if:  You begin to have strong, frequent contractions  Your water breaks.  Sometimes it is a big gush of fluid, sometimes it is just a trickle that keeps getting your panties wet or running down your legs  You have vaginal bleeding.  It is normal to have a small amount of spotting if your cervix was checked.   You don't feel your baby moving like normal.  If you don't, get you something to eat and drink and lay down and focus on feeling your baby move.  You should feel at least 10 movements in 2 hours.  If you don't, you should call the office or go to [facility]   Call the office [phone]) or go to [facility] hospital for these signs of pre-eclampsia:  Severe headache that does not go away with Tylenol  Visual changes- seeing spots, double, blurred vision  Pain under your right breast or upper abdomen that does not go away with Tums or heartburn medicine  Nausea and/or vomiting  Severe swelling in your hands, feet, and face    Home Blood Pressure Monitoring for Patients   Your provider has recommended that you check your blood pressure (BP) at least once a week at home. If you do not have a blood pressure cuff at home, one will be provided for you. Contact your provider if you have not received your monitor within 1 week.   Helpful Tips for Accurate Home Blood Pressure Checks  . Don't smoke, exercise, or drink caffeine 30 minutes before checking your BP . Use the restroom before checking your BP (a full  bladder can raise your pressure) . Relax in a comfortable upright chair . Feet on the ground . Left arm resting comfortably on a flat surface at the level of your heart . Legs uncrossed . Back supported . Sit quietly and don't talk . Place the cuff on your bare arm . Adjust snuggly, so that only two fingertips can fit between your skin and the top of the cuff . Check 2 readings separated by at least one minute . Keep a log of your BP readings . For a visual, please reference this diagram: http://ccnc.care/bpdiagram  Provider Name: [facility]     Phone: [phone]  Zone 1: ALL CLEAR  Continue to monitor your symptoms:  . BP reading is less than 140 (top number) or less than 90 (bottom number)  . No right upper stomach pain . No headaches or seeing spots . No feeling nauseated or throwing up . No swelling in face and hands  Zone 2: CAUTION Call your doctor's office for any of the following:  . BP reading is greater than 140 (top number) or greater than 90 (bottom number)  . Stomach pain under your ribs in the middle or right side . Headaches or seeing spots . Feeling nauseated or throwing up . Swelling in face and hands  Zone 3: EMERGENCY  Seek immediate medical care if you have any of  the following:  . BP reading is greater than160 (top number) or greater than 110 (bottom number) . Severe headaches not improving with Tylenol . Serious difficulty catching your breath . Any worsening symptoms from Zone 2  Preterm Labor and Birth Information  The normal length of a pregnancy is 39-41 weeks. Preterm labor is when labor starts before 37 completed weeks of pregnancy. What are the risk factors for preterm labor? Preterm labor is more likely to occur in women who:  Have certain infections during pregnancy such as a bladder infection, sexually transmitted infection, or infection inside the uterus (chorioamnionitis).  Have a shorter-than-normal cervix.  Have gone into  preterm labor before.  Have had surgery on their cervix.  Are younger than age 54 or older than age 25.  Are African American.  Are pregnant with twins or multiple babies (multiple gestation).  Take street drugs or smoke while pregnant.  Do not gain enough weight while pregnant.  Became pregnant shortly after having been pregnant. What are the symptoms of preterm labor? Symptoms of preterm labor include:  Cramps similar to those that can happen during a menstrual period. The cramps may happen with diarrhea.  Pain in the abdomen or lower back.  Regular uterine contractions that may feel like tightening of the abdomen.  A feeling of increased pressure in the pelvis.  Increased watery or bloody mucus discharge from the vagina.  Water breaking (ruptured amniotic sac). Why is it important to recognize signs of preterm labor? It is important to recognize signs of preterm labor because babies who are born prematurely may not be fully developed. This can put them at an increased risk for:  Long-term (chronic) heart and lung problems.  Difficulty immediately after birth with regulating body systems, including blood sugar, body temperature, heart rate, and breathing rate.  Bleeding in the brain.  Cerebral palsy.  Learning difficulties.  Death. These risks are highest for babies who are born before 48 weeks of pregnancy. How is preterm labor treated? Treatment depends on the length of your pregnancy, your condition, and the health of your baby. It may involve: 1. Having a stitch (suture) placed in your cervix to prevent your cervix from opening too early (cerclage). 2. Taking or being given medicines, such as: ? Hormone medicines. These may be given early in pregnancy to help support the pregnancy. ? Medicine to stop contractions. ? Medicines to help mature the baby's lungs. These may be prescribed if the risk of delivery is high. ? Medicines to prevent your baby from  developing cerebral palsy. If the labor happens before 34 weeks of pregnancy, you may need to stay in the hospital. What should I do if I think I am in preterm labor? If you think that you are going into preterm labor, call your health care provider right away. How can I prevent preterm labor in future pregnancies? To increase your chance of having a full-term pregnancy:  Do not use any tobacco products, such as cigarettes, chewing tobacco, and e-cigarettes. If you need help quitting, ask your health care provider.  Do not use street drugs or medicines that have not been prescribed to you during your pregnancy.  Talk with your health care provider before taking any herbal supplements, even if you have been taking them regularly.  Make sure you gain a healthy amount of weight during your pregnancy.  Watch for infection. If you think that you might have an infection, get it checked right away.  Make sure to  tell your health care provider if you have gone into preterm labor before. This information is not intended to replace advice given to you by your health care provider. Make sure you discuss any questions you have with your health care provider. Document Revised: 12/11/2018 Document Reviewed: 01/10/2016 Elsevier Patient Education  [place].

## 2020-08-22 NOTE — Progress Notes (Signed)
LOW-RISK PREGNANCY VISIT Patient name: [name] MRN [id number]  Date of birth: 04/16/1986 Chief Complaint:   Routine Prenatal Visit  History of Present Illness:   Allison Pratt is a 34 y.o. G36P1021 female at [redacted]w[redacted]d with an Estimated Date of Delivery: 10/06/20 being seen today for ongoing management of a low-risk pregnancy.  Depression screen [facility] 2/9 07/05/2020 03/29/2020 02/02/2020  Decreased Interest 0 1 1  Down, Depressed, Hopeless 2 0 0  PHQ - 2 Score 2 1 1   Altered sleeping 0 0 -  Tired, decreased energy 0 2 -  Change in appetite 0 1 -  Feeling bad or failure about yourself  1 0 -  Trouble concentrating 0 0 -  Moving slowly or fidgety/restless 0 0 -  Suicidal thoughts 0 0 -  PHQ-9 Score 3 4 -    Today she reports no complaints. Contractions: Not present. Vag. Bleeding: None.  Movement: Present. denies leaking of fluid. Review of Systems:   Pertinent items are noted in HPI Denies abnormal vaginal discharge w/ itching/odor/irritation, headaches, visual changes, shortness of breath, chest pain, abdominal pain, severe nausea/vomiting, or problems with urination or bowel movements unless otherwise stated above. Pertinent History Reviewed:  Reviewed past medical,surgical, social, obstetrical and family history.  Reviewed problem list, medications and allergies. Physical Assessment:   Vitals:   08/22/20 0921  BP: 92/62  Pulse: 94  Weight: 186 lb 8 oz (84.6 kg)  Body mass index is 33.04 kg/m.        Physical Examination:   General appearance: Well appearing, and in no distress  Mental status: Alert, oriented to person, place, and time  Skin: Warm & dry  Cardiovascular: Normal heart rate noted  Respiratory: Normal respiratory effort, no distress  Abdomen: Soft, gravid, nontender  Pelvic: Cervical exam deferred         Extremities: Edema: None  Fetal Status: Fetal Heart Rate (bpm): 150 Fundal Height: 32 cm Movement: Present    Chaperone: N/A   Results for orders  placed or performed in visit on 08/22/20 (from the past 24 hour(s))  POC Urinalysis Dipstick OB   Collection Time: 08/22/20  9:22 AM  Result Value Ref Range   Color, UA     Clarity, UA     Glucose, UA Negative Negative   Bilirubin, UA     Ketones, UA neg    Spec Grav, UA     Blood, UA neg    pH, UA     POC,PROTEIN,UA Negative Negative, Trace, Small (1+), Moderate (2+), Large (3+), 4+   Urobilinogen, UA     Nitrite, UA neg    Leukocytes, UA Moderate (2+) (A) Negative   Appearance     Odor      Assessment & Plan:  1) Low-risk pregnancy [id number] at [redacted]w[redacted]d with an Estimated Date of Delivery: 10/06/20   2) Prev c/s, for RCS w/ BTL/salpingectomy   Meds: No orders of the defined types were placed in this encounter.  Labs/procedures today: none  Plan:  Continue routine obstetrical care  Next visit: prefers in person    Reviewed: Preterm labor symptoms and general obstetric precautions including but not limited to vaginal bleeding, contractions, leaking of fluid and fetal movement were reviewed in detail with the patient.  All questions were answered.   Follow-up: Return in about 2 weeks (around 09/05/2020) for LROB, MD only, in person.  No future appointments.  Orders Placed This Encounter  Procedures  . POC Urinalysis Dipstick OB  [place], [facility] 08/22/2020 9:35 AM

## 2020-09-07 ENCOUNTER — Encounter: Payer: Self-pay | Admitting: Obstetrics and Gynecology

## 2020-09-07 ENCOUNTER — Ambulatory Visit (INDEPENDENT_AMBULATORY_CARE_PROVIDER_SITE_OTHER): Payer: BC Managed Care – PPO | Admitting: Obstetrics and Gynecology

## 2020-09-07 ENCOUNTER — Other Ambulatory Visit: Payer: Self-pay

## 2020-09-07 VITALS — BP 106/68 | HR 86 | Wt 188.8 lb

## 2020-09-07 DIAGNOSIS — Z302 Encounter for sterilization: Secondary | ICD-10-CM

## 2020-09-07 DIAGNOSIS — O34219 Maternal care for unspecified type scar from previous cesarean delivery: Secondary | ICD-10-CM

## 2020-09-07 DIAGNOSIS — Z3480 Encounter for supervision of other normal pregnancy, unspecified trimester: Secondary | ICD-10-CM

## 2020-09-07 DIAGNOSIS — Z1389 Encounter for screening for other disorder: Secondary | ICD-10-CM

## 2020-09-07 DIAGNOSIS — Z3A35 35 weeks gestation of pregnancy: Secondary | ICD-10-CM

## 2020-09-07 DIAGNOSIS — Z331 Pregnant state, incidental: Secondary | ICD-10-CM

## 2020-09-07 LAB — POCT URINALYSIS DIPSTICK OB
Blood, UA: NEGATIVE
Glucose, UA: NEGATIVE
Ketones, UA: NEGATIVE
Nitrite, UA: NEGATIVE

## 2020-09-07 NOTE — Addendum Note (Signed)
Addended by: Hermina Staggers on: 09/07/2020 09:15 AM   Modules accepted: Orders, SmartSet

## 2020-09-07 NOTE — Progress Notes (Signed)
Subjective:  Allison Pratt is a 35 y.o. [id number] at [redacted]w[redacted]d being seen today for ongoing prenatal care.  She is currently monitored for the following issues for this low-risk pregnancy and has History of ectopic pregnancy; Encounter for supervision of normal pregnancy, antepartum; Marijuana use; Request for sterilization; and Previous cesarean delivery affecting pregnancy on their problem list.  Patient reports no complaints.  Contractions: Not present. Vag. Bleeding: None.  Movement: Present. Denies leaking of fluid.   The following portions of the patient's history were reviewed and updated as appropriate: allergies, current medications, past family history, past medical history, past social history, past surgical history and problem list. Problem list updated.  Objective:   Vitals:   09/07/20 0840  BP: 106/68  Pulse: 86  Weight: 188 lb 12.8 oz (85.6 kg)    Fetal Status:     Movement: Present     General:  Alert, oriented and cooperative. Patient is in no acute distress.  Skin: Skin is warm and dry. No rash noted.   Cardiovascular: Normal heart rate noted  Respiratory: Normal respiratory effort, no problems with respiration noted  Abdomen: Soft, gravid, appropriate for gestational age. Pain/Pressure: Absent     Pelvic:  Cervical exam deferred        Extremities: Normal range of motion.  Edema: None  Mental Status: Normal mood and affect. Normal behavior. Normal judgment and thought content.   Urinalysis:      Assessment and Plan:  Pregnancy: G4P1021 at [redacted]w[redacted]d  1. [redacted] weeks gestation of pregnancy  - POC Urinalysis Dipstick OB  2. Screening for genitourinary condition  - POC Urinalysis Dipstick OB  3. Pregnant state, incidental  - POC Urinalysis Dipstick OB  4. Supervision of other normal pregnancy, antepartum Stable GBS next visit  5. Previous cesarean delivery affecting pregnancy Scheduled for 39 weeks  6. Request for sterilization Papers signed  Preterm labor  symptoms and general obstetric precautions including but not limited to vaginal bleeding, contractions, leaking of fluid and fetal movement were reviewed in detail with the patient. Please refer to After Visit Summary for other counseling recommendations.  Return in about 1 week (around 09/14/2020) for OB visit, face to face, any provider.   Hermina Staggers, MD

## 2020-09-07 NOTE — Patient Instructions (Signed)
Third Trimester of Pregnancy The third trimester is from week 28 through week 40 (months 7 through 9). The third trimester is a time when the unborn baby (fetus) is growing rapidly. At the end of the ninth month, the fetus is about 20 inches in length and weighs 6-10 pounds. Body changes during your third trimester Your body will continue to go through many changes during pregnancy. The changes vary from woman to woman. During the third trimester:  Your weight will continue to increase. You can expect to gain 25-35 pounds (11-16 kg) by the end of the pregnancy.  You may begin to get stretch marks on your hips, abdomen, and breasts.  You may urinate more often because the fetus is moving lower into your pelvis and pressing on your bladder.  You may develop or continue to have heartburn. This is caused by increased hormones that slow down muscles in the digestive tract.  You may develop or continue to have constipation because increased hormones slow digestion and cause the muscles that push waste through your intestines to relax.  You may develop hemorrhoids. These are swollen veins (varicose veins) in the rectum that can itch or be painful.  You may develop swollen, bulging veins (varicose veins) in your legs.  You may have increased body aches in the pelvis, back, or thighs. This is due to weight gain and increased hormones that are relaxing your joints.  You may have changes in your hair. These can include thickening of your hair, rapid growth, and changes in texture. Some women also have hair loss during or after pregnancy, or hair that feels dry or thin. Your hair will most likely return to normal after your baby is born.  Your breasts will continue to grow and they will continue to become tender. A yellow fluid (colostrum) may leak from your breasts. This is the first milk you are producing for your baby.  Your belly button may stick out.  You may notice more swelling in your hands,  face, or ankles.  You may have increased tingling or numbness in your hands, arms, and legs. The skin on your belly may also feel numb.  You may feel short of breath because of your expanding uterus.  You may have more problems sleeping. This can be caused by the size of your belly, increased need to urinate, and an increase in your body's metabolism.  You may notice the fetus "dropping," or moving lower in your abdomen (lightening).  You may have increased vaginal discharge.  You may notice your joints feel loose and you may have pain around your pelvic bone. What to expect at prenatal visits You will have prenatal exams every 2 weeks until week 36. Then you will have weekly prenatal exams. During a routine prenatal visit:  You will be weighed to make sure you and the baby are growing normally.  Your blood pressure will be taken.  Your abdomen will be measured to track your baby's growth.  The fetal heartbeat will be listened to.  Any test results from the previous visit will be discussed.  You may have a cervical check near your due date to see if your cervix has softened or thinned (effaced).  You will be tested for Group B streptococcus. This happens between 35 and 37 weeks. Your health care provider may ask you:  What your birth plan is.  How you are feeling.  If you are feeling the baby move.  If you have had any abnormal   symptoms, such as leaking fluid, bleeding, severe headaches, or abdominal cramping.  If you are using any tobacco products, including cigarettes, chewing tobacco, and electronic cigarettes.  If you have any questions. Other tests or screenings that may be performed during your third trimester include:  Blood tests that check for low iron levels (anemia).  Fetal testing to check the health, activity level, and growth of the fetus. Testing is done if you have certain medical conditions or if there are problems during the pregnancy.  Nonstress test  (NST). This test checks the health of your baby to make sure there are no signs of problems, such as the baby not getting enough oxygen. During this test, a belt is placed around your belly. The baby is made to move, and its heart rate is monitored during movement. What is false labor? False labor is a condition in which you feel small, irregular tightenings of the muscles in the womb (contractions) that usually go away with rest, changing position, or drinking water. These are called Braxton Hicks contractions. Contractions may last for hours, days, or even weeks before true labor sets in. If contractions come at regular intervals, become more frequent, increase in intensity, or become painful, you should see your health care provider. What are the signs of labor?  Abdominal cramps.  Regular contractions that start at 10 minutes apart and become stronger and more frequent with time.  Contractions that start on the top of the uterus and spread down to the lower abdomen and back.  Increased pelvic pressure and dull back pain.  A watery or bloody mucus discharge that comes from the vagina.  Leaking of amniotic fluid. This is also known as your "water breaking." It could be a slow trickle or a gush. Let your health care provider know if it has a color or strange odor. If you have any of these signs, call your health care provider right away, even if it is before your due date. Follow these instructions at home: Medicines  Follow your health care provider's instructions regarding medicine use. Specific medicines may be either safe or unsafe to take during pregnancy.  Take a prenatal vitamin that contains at least 600 micrograms (mcg) of folic acid.  If you develop constipation, try taking a stool softener if your health care provider approves. Eating and drinking   Eat a balanced diet that includes fresh fruits and vegetables, whole grains, good sources of protein such as meat, eggs, or tofu,  and low-fat dairy. Your health care provider will help you determine the amount of weight gain that is right for you.  Avoid raw meat and uncooked cheese. These carry germs that can cause birth defects in the baby.  If you have low calcium intake from food, talk to your health care provider about whether you should take a daily calcium supplement.  Eat four or five small meals rather than three large meals a day.  Limit foods that are high in fat and processed sugars, such as fried and sweet foods.  To prevent constipation: ? Drink enough fluid to keep your urine clear or pale yellow. ? Eat foods that are high in fiber, such as fresh fruits and vegetables, whole grains, and beans. Activity  Exercise only as directed by your health care provider. Most women can continue their usual exercise routine during pregnancy. Try to exercise for 30 minutes at least 5 days a week. Stop exercising if you experience uterine contractions.  Avoid heavy lifting.  Do   not exercise in extreme heat or humidity, or at high altitudes.  Wear low-heel, comfortable shoes.  Practice good posture.  You may continue to have sex unless your health care provider tells you otherwise. Relieving pain and discomfort  Take frequent breaks and rest with your legs elevated if you have leg cramps or low back pain.  Take warm sitz baths to soothe any pain or discomfort caused by hemorrhoids. Use hemorrhoid cream if your health care provider approves.  Wear a good support bra to prevent discomfort from breast tenderness.  If you develop varicose veins: ? Wear support pantyhose or compression stockings as told by your healthcare provider. ? Elevate your feet for 15 minutes, 3-4 times a day. Prenatal care  Write down your questions. Take them to your prenatal visits.  Keep all your prenatal visits as told by your health care provider. This is important. Safety  Wear your seat belt at all times when driving.  Make  a list of emergency phone numbers, including numbers for family, friends, the hospital, and police and fire departments. General instructions  Avoid cat litter boxes and soil used by cats. These carry germs that can cause birth defects in the baby. If you have a cat, ask someone to clean the litter box for you.  Do not travel far distances unless it is absolutely necessary and only with the approval of your health care provider.  Do not use hot tubs, steam rooms, or saunas.  Do not drink alcohol.  Do not use any products that contain nicotine or tobacco, such as cigarettes and e-cigarettes. If you need help quitting, ask your health care provider.  Do not use any medicinal herbs or unprescribed drugs. These chemicals affect the formation and growth of the baby.  Do not douche or use tampons or scented sanitary pads.  Do not cross your legs for long periods of time.  To prepare for the arrival of your baby: ? Take prenatal classes to understand, practice, and ask questions about labor and delivery. ? Make a trial run to the hospital. ? Visit the hospital and tour the maternity area. ? Arrange for maternity or paternity leave through employers. ? Arrange for family and friends to take care of pets while you are in the hospital. ? Purchase a rear-facing car seat and make sure you know how to install it in your car. ? Pack your hospital bag. ? Prepare the baby's nursery. Make sure to remove all pillows and stuffed animals from the baby's crib to prevent suffocation.  Visit your dentist if you have not gone during your pregnancy. Use a soft toothbrush to brush your teeth and be gentle when you floss. Contact a health care provider if:  You are unsure if you are in labor or if your water has broken.  You become dizzy.  You have mild pelvic cramps, pelvic pressure, or nagging pain in your abdominal area.  You have lower back pain.  You have persistent nausea, vomiting, or  diarrhea.  You have an unusual or bad smelling vaginal discharge.  You have pain when you urinate. Get help right away if:  Your water breaks before 37 weeks.  You have regular contractions less than 5 minutes apart before 37 weeks.  You have a fever.  You are leaking fluid from your vagina.  You have spotting or bleeding from your vagina.  You have severe abdominal pain or cramping.  You have rapid weight loss or weight gain.  You have   shortness of breath with chest pain.  You notice sudden or extreme swelling of your face, hands, ankles, feet, or legs.  Your baby makes fewer than 10 movements in 2 hours.  You have severe headaches that do not go away when you take medicine.  You have vision changes. Summary  The third trimester is from week 28 through week 40, months 7 through 9. The third trimester is a time when the unborn baby (fetus) is growing rapidly.  During the third trimester, your discomfort may increase as you and your baby continue to gain weight. You may have abdominal, leg, and back pain, sleeping problems, and an increased need to urinate.  During the third trimester your breasts will keep growing and they will continue to become tender. A yellow fluid (colostrum) may leak from your breasts. This is the first milk you are producing for your baby.  False labor is a condition in which you feel small, irregular tightenings of the muscles in the womb (contractions) that eventually go away. These are called Braxton Hicks contractions. Contractions may last for hours, days, or even weeks before true labor sets in.  Signs of labor can include: abdominal cramps; regular contractions that start at 10 minutes apart and become stronger and more frequent with time; watery or bloody mucus discharge that comes from the vagina; increased pelvic pressure and dull back pain; and leaking of amniotic fluid. This information is not intended to replace advice given to you by your  health care provider. Make sure you discuss any questions you have with your health care provider. Document Revised: 12/10/2018 Document Reviewed: 09/24/2016 Elsevier Patient Education  2020 [employer]  

## 2020-09-12 ENCOUNTER — Other Ambulatory Visit: Payer: Self-pay | Admitting: Obstetrics & Gynecology

## 2020-09-14 ENCOUNTER — Other Ambulatory Visit (HOSPITAL_COMMUNITY)
Admission: RE | Admit: 2020-09-14 | Discharge: 2020-09-14 | Disposition: A | Discharge status: Home / Self Care | Payer: BC Managed Care – PPO | Source: Ambulatory Visit | Attending: Medical | Admitting: Medical

## 2020-09-14 ENCOUNTER — Other Ambulatory Visit: Payer: Self-pay

## 2020-09-14 ENCOUNTER — Encounter: Payer: Self-pay | Admitting: Advanced Practice Midwife

## 2020-09-14 ENCOUNTER — Ambulatory Visit (INDEPENDENT_AMBULATORY_CARE_PROVIDER_SITE_OTHER): Payer: BC Managed Care – PPO | Admitting: Advanced Practice Midwife

## 2020-09-14 VITALS — BP 99/64 | HR 102 | Wt 191.0 lb

## 2020-09-14 DIAGNOSIS — R3121 Asymptomatic microscopic hematuria: Secondary | ICD-10-CM

## 2020-09-14 DIAGNOSIS — R892 Abnormal level of other drugs, medicaments and biological substances in specimens from other organs, systems and tissues: Secondary | ICD-10-CM

## 2020-09-14 DIAGNOSIS — Z3A36 36 weeks gestation of pregnancy: Secondary | ICD-10-CM | POA: Diagnosis present

## 2020-09-14 DIAGNOSIS — Z3480 Encounter for supervision of other normal pregnancy, unspecified trimester: Secondary | ICD-10-CM | POA: Diagnosis present

## 2020-09-14 DIAGNOSIS — Z331 Pregnant state, incidental: Secondary | ICD-10-CM

## 2020-09-14 DIAGNOSIS — Z1389 Encounter for screening for other disorder: Secondary | ICD-10-CM

## 2020-09-14 LAB — POCT URINALYSIS DIPSTICK OB
Glucose, UA: NEGATIVE
Ketones, UA: NEGATIVE
Nitrite, UA: NEGATIVE

## 2020-09-14 NOTE — Progress Notes (Signed)
   LOW-RISK PREGNANCY VISIT Patient name: Allison Pratt MRN [id number]  Date of birth: [birth year] Chief Complaint:   Routine Prenatal Visit (GBS, GC/CHL; ? leaking fluid)  History of Present Illness:   NHI BUTRUM is a 35 y.o. G41P1021 female at [redacted]w[redacted]d with an Estimated Date of Delivery: 10/06/20 being seen today for ongoing management of a low-risk pregnancy.  Today she . Contractions: Not present. Vag. Bleeding: Scant.  Movement: Present. reports leaking of fluid for 2 days.  Clear, feels it "running out" at times.   Review of Systems:   Pertinent items are noted in HPI Denies abnormal vaginal discharge w/ itching/odor/irritation, headaches, visual changes, shortness of breath, chest pain, abdominal pain, severe nausea/vomiting, or problems with urination or bowel movements unless otherwise stated above. Pertinent History Reviewed:  Reviewed past medical,surgical, social, obstetrical and family history.  Reviewed problem list, medications and allergies. Physical Assessment:   Vitals:   09/14/20 1030  BP: 99/64  Pulse: (!) 102  Weight: 191 lb (86.6 kg)  Body mass index is 33.83 kg/m.        Physical Examination:   General appearance: Well appearing, and in no distress  Mental status: Alert, oriented to person, place, and time  Skin: Warm & dry  Cardiovascular: Normal heart rate noted  Respiratory: Normal respiratory effort, no distress  Abdomen: Soft, gravid, nontender  Pelvic: Cervical exam performed  Dilation: 1 Effacement (%): Thick Station: -2  Scant discharge, No pooling; fern, nitrazine and valsalva negative  Extremities: Edema: None  Fetal Status: Fetal Heart Rate (bpm): 150 Fundal Height: 36 cm Movement: Present Presentation: Vertex  Chaperone: Levy Pupa    Results for orders placed or performed in visit on 09/14/20 (from the past 24 hour(s))  POC Urinalysis Dipstick OB   Collection Time: 09/14/20 10:31 AM  Result Value Ref Range   Color, UA     Clarity, UA      Glucose, UA Negative Negative   Bilirubin, UA     Ketones, UA neg    Spec Grav, UA     Blood, UA 2+    pH, UA     POC,PROTEIN,UA Small (1+) Negative, Trace, Small (1+), Moderate (2+), Large (3+), 4+   Urobilinogen, UA     Nitrite, UA neg    Leukocytes, UA Trace (A) Negative   Appearance     Odor      Assessment & Plan:  1) Low-risk pregnancy [id number] at [redacted]w[redacted]d with an Estimated Date of Delivery: 10/06/20   2) Plans eRLTCS, already scheduled for 09/29/20   Meds: No orders of the defined types were placed in this encounter.  Labs/procedures today: GBS/GC/CHL  Plan:  Continue routine obstetrical care  Next visit: prefers in person    Reviewed: Term labor symptoms and general obstetric precautions including but not limited to vaginal bleeding, contractions, leaking of fluid and fetal movement were reviewed in detail with the patient.  All questions were answered. Has home bp cuff.. Check bp weekly, let us know if >140/90.   Follow-up: Return in about 1 week (around 09/21/2020).  Orders Placed This Encounter  Procedures  . Strep Gp B NAA  . Urine Culture  . Pain Management Screening Profile (10S)  . POC Urinalysis Dipstick OB   Christin Fudge DNP, CNM 09/14/2020 10:51 AM

## 2020-09-14 NOTE — Patient Instructions (Signed)

## 2020-09-15 ENCOUNTER — Encounter: Payer: BC Managed Care – PPO | Admitting: Medical

## 2020-09-15 LAB — CERVICOVAGINAL ANCILLARY ONLY
Chlamydia: NEGATIVE
Comment: NEGATIVE
Comment: NORMAL
Neisseria Gonorrhea: NEGATIVE

## 2020-09-16 LAB — PMP SCREEN PROFILE (10S), URINE
Amphetamine Scrn, Ur: NEGATIVE ng/mL
BARBITURATE SCREEN URINE: NEGATIVE ng/mL
BENZODIAZEPINE SCREEN, URINE: NEGATIVE ng/mL
CANNABINOIDS UR QL SCN: NEGATIVE ng/mL
Cocaine (Metab) Scrn, Ur: NEGATIVE ng/mL
Creatinine(Crt), U: 260.1 mg/dL (ref 20.0–300.0)
Methadone Screen, Urine: NEGATIVE ng/mL
OXYCODONE+OXYMORPHONE UR QL SCN: NEGATIVE ng/mL
Opiate Scrn, Ur: NEGATIVE ng/mL
Ph of Urine: 6.2 (ref 4.5–8.9)
Phencyclidine Qn, Ur: NEGATIVE ng/mL
Propoxyphene Scrn, Ur: NEGATIVE ng/mL

## 2020-09-16 LAB — STREP GP B NAA: Strep Gp B NAA: POSITIVE — AB

## 2020-09-17 LAB — URINE CULTURE

## 2020-09-19 ENCOUNTER — Other Ambulatory Visit: Payer: Self-pay | Admitting: Advanced Practice Midwife

## 2020-09-19 DIAGNOSIS — Z029 Encounter for administrative examinations, unspecified: Secondary | ICD-10-CM

## 2020-09-19 MED ORDER — AMPICILLIN 500 MG PO CAPS: 500.0000 mg | ORAL_CAPSULE | Freq: Three times a day (TID) | ORAL | 0 refills | Status: DC

## 2020-09-19 NOTE — Progress Notes (Signed)
Ampicillin for GBS urine (hematuria and proteinuria w/o VB, so although could be contaminated specimen, will err on the side of caution).

## 2020-09-20 ENCOUNTER — Encounter: Payer: Self-pay | Admitting: *Deleted

## 2020-09-20 ENCOUNTER — Inpatient Hospital Stay (HOSPITAL_COMMUNITY): Payer: BC Managed Care – PPO | Admitting: Anesthesiology

## 2020-09-20 ENCOUNTER — Other Ambulatory Visit: Payer: Self-pay

## 2020-09-20 ENCOUNTER — Telehealth (HOSPITAL_COMMUNITY): Payer: Self-pay | Admitting: *Deleted

## 2020-09-20 ENCOUNTER — Encounter (HOSPITAL_COMMUNITY): Payer: Self-pay | Admitting: Obstetrics and Gynecology

## 2020-09-20 ENCOUNTER — Inpatient Hospital Stay (HOSPITAL_COMMUNITY)
Admission: AD | Admit: 2020-09-20 | Payer: BC Managed Care – PPO | Source: Home / Self Care | Admitting: Obstetrics & Gynecology

## 2020-09-20 ENCOUNTER — Encounter: Payer: BC Managed Care – PPO | Admitting: Women's Health

## 2020-09-20 ENCOUNTER — Other Ambulatory Visit: Payer: Self-pay | Admitting: Family Medicine

## 2020-09-20 ENCOUNTER — Inpatient Hospital Stay (HOSPITAL_COMMUNITY)
Admission: AD | Admit: 2020-09-20 | Discharge: 2020-09-22 | DRG: 784 | Disposition: A | Discharge status: Home / Self Care | Payer: BC Managed Care – PPO | Attending: Obstetrics and Gynecology | Admitting: Obstetrics and Gynecology

## 2020-09-20 ENCOUNTER — Encounter (HOSPITAL_COMMUNITY)
Admission: AD | Disposition: A | Discharge status: Home / Self Care | Payer: Self-pay | Source: Home / Self Care | Attending: Obstetrics and Gynecology

## 2020-09-20 ENCOUNTER — Encounter: Payer: Self-pay | Admitting: Women's Health

## 2020-09-20 ENCOUNTER — Ambulatory Visit (INDEPENDENT_AMBULATORY_CARE_PROVIDER_SITE_OTHER): Payer: BC Managed Care – PPO | Admitting: Women's Health

## 2020-09-20 VITALS — BP 102/69 | HR 101 | Wt 194.0 lb

## 2020-09-20 DIAGNOSIS — O34211 Maternal care for low transverse scar from previous cesarean delivery: Principal | ICD-10-CM | POA: Diagnosis present

## 2020-09-20 DIAGNOSIS — Z87891 Personal history of nicotine dependence: Secondary | ICD-10-CM

## 2020-09-20 DIAGNOSIS — F1290 Cannabis use, unspecified, uncomplicated: Secondary | ICD-10-CM | POA: Diagnosis present

## 2020-09-20 DIAGNOSIS — O99214 Obesity complicating childbirth: Secondary | ICD-10-CM | POA: Diagnosis present

## 2020-09-20 DIAGNOSIS — Z302 Encounter for sterilization: Secondary | ICD-10-CM

## 2020-09-20 DIAGNOSIS — O4292 Full-term premature rupture of membranes, unspecified as to length of time between rupture and onset of labor: Secondary | ICD-10-CM | POA: Diagnosis present

## 2020-09-20 DIAGNOSIS — Z3A37 37 weeks gestation of pregnancy: Secondary | ICD-10-CM

## 2020-09-20 DIAGNOSIS — O34219 Maternal care for unspecified type scar from previous cesarean delivery: Secondary | ICD-10-CM

## 2020-09-20 DIAGNOSIS — Z9851 Tubal ligation status: Secondary | ICD-10-CM

## 2020-09-20 DIAGNOSIS — Z20822 Contact with and (suspected) exposure to covid-19: Secondary | ICD-10-CM | POA: Diagnosis present

## 2020-09-20 DIAGNOSIS — O99820 Streptococcus B carrier state complicating pregnancy: Secondary | ICD-10-CM | POA: Diagnosis not present

## 2020-09-20 DIAGNOSIS — Z98891 History of uterine scar from previous surgery: Secondary | ICD-10-CM

## 2020-09-20 DIAGNOSIS — O99824 Streptococcus B carrier state complicating childbirth: Secondary | ICD-10-CM | POA: Diagnosis present

## 2020-09-20 DIAGNOSIS — O99324 Drug use complicating childbirth: Secondary | ICD-10-CM | POA: Diagnosis present

## 2020-09-20 DIAGNOSIS — Z3483 Encounter for supervision of other normal pregnancy, third trimester: Secondary | ICD-10-CM

## 2020-09-20 DIAGNOSIS — O9081 Anemia of the puerperium: Secondary | ICD-10-CM | POA: Diagnosis not present

## 2020-09-20 DIAGNOSIS — D62 Acute posthemorrhagic anemia: Secondary | ICD-10-CM | POA: Diagnosis not present

## 2020-09-20 DIAGNOSIS — Z3480 Encounter for supervision of other normal pregnancy, unspecified trimester: Secondary | ICD-10-CM

## 2020-09-20 DIAGNOSIS — O4290 Premature rupture of membranes, unspecified as to length of time between rupture and onset of labor, unspecified weeks of gestation: Secondary | ICD-10-CM

## 2020-09-20 LAB — CBC
HCT: 33.6 % — ABNORMAL LOW (ref 36.0–46.0)
Hemoglobin: 10.7 g/dL — ABNORMAL LOW (ref 12.0–15.0)
MCH: 30.9 pg (ref 26.0–34.0)
MCHC: 31.8 g/dL (ref 30.0–36.0)
MCV: 97.1 fL (ref 80.0–100.0)
Platelets: 269 10*3/uL (ref 150–400)
RBC: 3.46 MIL/uL — ABNORMAL LOW (ref 3.87–5.11)
RDW: 14.9 % (ref 11.5–15.5)
WBC: 7.4 10*3/uL (ref 4.0–10.5)
nRBC: 0.3 % — ABNORMAL HIGH (ref 0.0–0.2)

## 2020-09-20 LAB — RESP PANEL BY RT-PCR (FLU A&B, COVID) ARPGX2
Influenza A by PCR: NEGATIVE
Influenza B by PCR: NEGATIVE
SARS Coronavirus 2 by RT PCR: NEGATIVE

## 2020-09-20 LAB — TYPE AND SCREEN
ABO/RH(D): O POS
Antibody Screen: NEGATIVE

## 2020-09-20 SURGERY — Surgical Case
Anesthesia: Spinal | Laterality: Bilateral

## 2020-09-20 MED ORDER — SIMETHICONE 80 MG PO CHEW
80.0000 mg | CHEWABLE_TABLET | Freq: Three times a day (TID) | ORAL | Status: DC
  Administered 2020-09-21 – 2020-09-22 (×4): 80 mg via ORAL
  Filled 2020-09-20 (×4): qty 1

## 2020-09-20 MED ORDER — NALBUPHINE HCL 10 MG/ML IJ SOLN
5.0000 mg | Freq: Once | INTRAMUSCULAR | Status: DC | PRN
Start: 2020-09-20 — End: 2020-09-22

## 2020-09-20 MED ORDER — OXYTOCIN-SODIUM CHLORIDE 30-0.9 UT/500ML-% IV SOLN: 2.5000 [IU]/h | INTRAVENOUS | Status: AC

## 2020-09-20 MED ORDER — KETOROLAC TROMETHAMINE 30 MG/ML IJ SOLN
INTRAMUSCULAR | Status: AC
  Filled 2020-09-20: qty 1

## 2020-09-20 MED ORDER — DEXAMETHASONE SODIUM PHOSPHATE 4 MG/ML IJ SOLN
INTRAMUSCULAR | Status: DC | PRN
  Administered 2020-09-20: 10 mg via INTRAVENOUS

## 2020-09-20 MED ORDER — SIMETHICONE 80 MG PO CHEW
80.0000 mg | CHEWABLE_TABLET | ORAL | Status: DC | PRN
Start: 2020-09-20 — End: 2020-09-22

## 2020-09-20 MED ORDER — IBUPROFEN 800 MG PO TABS: 800.0000 mg | ORAL_TABLET | Freq: Four times a day (QID) | ORAL | Status: DC

## 2020-09-20 MED ORDER — BUPIVACAINE IN DEXTROSE 0.75-8.25 % IT SOLN
INTRATHECAL | Status: DC | PRN
  Administered 2020-09-20: 1.6 mL via INTRATHECAL

## 2020-09-20 MED ORDER — SODIUM CHLORIDE 0.9 % IV SOLN
INTRAVENOUS | Status: AC
  Filled 2020-09-20: qty 500

## 2020-09-20 MED ORDER — FENTANYL CITRATE (PF) 100 MCG/2ML IJ SOLN
INTRAMUSCULAR | Status: DC | PRN
  Administered 2020-09-20: 15 ug via INTRATHECAL

## 2020-09-20 MED ORDER — SODIUM CHLORIDE 0.9 % IV SOLN
500.0000 mg | INTRAVENOUS | Status: AC
  Administered 2020-09-20: 500 mg via INTRAVENOUS
  Filled 2020-09-20: qty 500

## 2020-09-20 MED ORDER — ACETAMINOPHEN 160 MG/5ML PO SOLN
ORAL | Status: AC
  Filled 2020-09-20: qty 40.6

## 2020-09-20 MED ORDER — ENOXAPARIN SODIUM 40 MG/0.4ML ~~LOC~~ SOLN
40.0000 mg | SUBCUTANEOUS | Status: DC
  Administered 2020-09-21: 40 mg via SUBCUTANEOUS
  Filled 2020-09-20: qty 0.4

## 2020-09-20 MED ORDER — MENTHOL 3 MG MT LOZG: 1.0000 | LOZENGE | OROMUCOSAL | Status: DC | PRN

## 2020-09-20 MED ORDER — BUPIVACAINE HCL (PF) 0.5 % IJ SOLN
INTRAMUSCULAR | Status: AC
  Filled 2020-09-20: qty 30

## 2020-09-20 MED ORDER — MORPHINE SULFATE (PF) 0.5 MG/ML IJ SOLN
INTRAMUSCULAR | Status: AC
  Filled 2020-09-20: qty 10

## 2020-09-20 MED ORDER — FENTANYL CITRATE (PF) 100 MCG/2ML IJ SOLN
INTRAMUSCULAR | Status: AC
  Filled 2020-09-20: qty 2

## 2020-09-20 MED ORDER — DIPHENHYDRAMINE HCL 25 MG PO CAPS
25.0000 mg | ORAL_CAPSULE | Freq: Four times a day (QID) | ORAL | Status: DC | PRN
Start: 2020-09-20 — End: 2020-09-22

## 2020-09-20 MED ORDER — CEFAZOLIN SODIUM-DEXTROSE 2-4 GM/100ML-% IV SOLN: 2.0000 g | INTRAVENOUS | Status: DC

## 2020-09-20 MED ORDER — ONDANSETRON HCL 4 MG/2ML IJ SOLN
INTRAMUSCULAR | Status: DC | PRN
  Administered 2020-09-20: 4 mg via INTRAVENOUS

## 2020-09-20 MED ORDER — ACETAMINOPHEN 500 MG PO TABS: 1000.0000 mg | ORAL_TABLET | ORAL | Status: DC

## 2020-09-20 MED ORDER — SODIUM CHLORIDE 0.9% FLUSH: 3.0000 mL | INTRAVENOUS | Status: DC | PRN

## 2020-09-20 MED ORDER — DIPHENHYDRAMINE HCL 50 MG/ML IJ SOLN: 12.5000 mg | INTRAMUSCULAR | Status: DC | PRN

## 2020-09-20 MED ORDER — NALBUPHINE HCL 10 MG/ML IJ SOLN: 5.0000 mg | INTRAMUSCULAR | Status: DC | PRN

## 2020-09-20 MED ORDER — ONDANSETRON HCL 4 MG/2ML IJ SOLN: 4.0000 mg | Freq: Three times a day (TID) | INTRAMUSCULAR | Status: DC | PRN

## 2020-09-20 MED ORDER — MEASLES, MUMPS & RUBELLA VAC IJ SOLR: 0.5000 mL | Freq: Once | INTRAMUSCULAR | Status: DC

## 2020-09-20 MED ORDER — PRENATAL MULTIVITAMIN CH
1.0000 | ORAL_TABLET | Freq: Every day | ORAL | Status: DC
  Administered 2020-09-21: 1 via ORAL
  Filled 2020-09-20: qty 1

## 2020-09-20 MED ORDER — ONDANSETRON HCL 4 MG/2ML IJ SOLN
INTRAMUSCULAR | Status: AC
  Filled 2020-09-20: qty 2

## 2020-09-20 MED ORDER — CEFAZOLIN SODIUM-DEXTROSE 2-3 GM-%(50ML) IV SOLR
INTRAVENOUS | Status: DC | PRN
  Administered 2020-09-20: 2 g via INTRAVENOUS

## 2020-09-20 MED ORDER — KETOROLAC TROMETHAMINE 30 MG/ML IJ SOLN
30.0000 mg | Freq: Four times a day (QID) | INTRAMUSCULAR | Status: AC | PRN
  Administered 2020-09-20: 30 mg via INTRAMUSCULAR

## 2020-09-20 MED ORDER — NALBUPHINE HCL 10 MG/ML IJ SOLN: 5.0000 mg | Freq: Once | INTRAMUSCULAR | Status: DC | PRN

## 2020-09-20 MED ORDER — SCOPOLAMINE 1 MG/3DAYS TD PT72
1.0000 | MEDICATED_PATCH | Freq: Once | TRANSDERMAL | Status: DC
  Administered 2020-09-20: 1.5 mg via TRANSDERMAL

## 2020-09-20 MED ORDER — PHENYLEPHRINE HCL-NACL 20-0.9 MG/250ML-% IV SOLN
INTRAVENOUS | Status: DC | PRN
  Administered 2020-09-20: 60 ug/min via INTRAVENOUS

## 2020-09-20 MED ORDER — LACTATED RINGERS IV SOLN: INTRAVENOUS | Status: DC

## 2020-09-20 MED ORDER — SOD CITRATE-CITRIC ACID 500-334 MG/5ML PO SOLN
ORAL | Status: AC
  Filled 2020-09-20: qty 30

## 2020-09-20 MED ORDER — WITCH HAZEL-GLYCERIN EX PADS: 1.0000 "application " | MEDICATED_PAD | CUTANEOUS | Status: DC | PRN

## 2020-09-20 MED ORDER — SOD CITRATE-CITRIC ACID 500-334 MG/5ML PO SOLN: 30.0000 mL | ORAL | Status: DC

## 2020-09-20 MED ORDER — TETANUS-DIPHTH-ACELL PERTUSSIS 5-2.5-18.5 LF-MCG/0.5 IM SUSY: 0.5000 mL | PREFILLED_SYRINGE | Freq: Once | INTRAMUSCULAR | Status: DC

## 2020-09-20 MED ORDER — DIPHENHYDRAMINE HCL 25 MG PO CAPS: 25.0000 mg | ORAL_CAPSULE | ORAL | Status: DC | PRN

## 2020-09-20 MED ORDER — DEXAMETHASONE SODIUM PHOSPHATE 10 MG/ML IJ SOLN
INTRAMUSCULAR | Status: AC
  Filled 2020-09-20: qty 1

## 2020-09-20 MED ORDER — KETOROLAC TROMETHAMINE 30 MG/ML IJ SOLN: 15.0000 mg | INTRAMUSCULAR | Status: DC

## 2020-09-20 MED ORDER — SCOPOLAMINE 1 MG/3DAYS TD PT72
MEDICATED_PATCH | TRANSDERMAL | Status: AC
  Filled 2020-09-20: qty 1

## 2020-09-20 MED ORDER — NALOXONE HCL 0.4 MG/ML IJ SOLN: 0.4000 mg | INTRAMUSCULAR | Status: DC | PRN

## 2020-09-20 MED ORDER — COCONUT OIL OIL: 1.0000 "application " | TOPICAL_OIL | Status: DC | PRN

## 2020-09-20 MED ORDER — POVIDONE-IODINE 10 % EX SWAB
2.0000 "application " | Freq: Once | CUTANEOUS | Status: AC
  Administered 2020-09-20: 2 via TOPICAL

## 2020-09-20 MED ORDER — MEPERIDINE HCL 25 MG/ML IJ SOLN: 6.2500 mg | INTRAMUSCULAR | Status: DC | PRN

## 2020-09-20 MED ORDER — LACTATED RINGERS IV SOLN: INTRAVENOUS | Status: DC | PRN

## 2020-09-20 MED ORDER — KETOROLAC TROMETHAMINE 30 MG/ML IJ SOLN
30.0000 mg | Freq: Four times a day (QID) | INTRAMUSCULAR | Status: AC
  Administered 2020-09-21 (×3): 30 mg via INTRAVENOUS
  Filled 2020-09-20 (×3): qty 1

## 2020-09-20 MED ORDER — OXYCODONE HCL 5 MG PO TABS
5.0000 mg | ORAL_TABLET | ORAL | Status: DC | PRN
  Administered 2020-09-21: 10 mg via ORAL
  Administered 2020-09-21 – 2020-09-22 (×2): 5 mg via ORAL
  Filled 2020-09-20: qty 1
  Filled 2020-09-20: qty 2
  Filled 2020-09-20: qty 1

## 2020-09-20 MED ORDER — SENNOSIDES-DOCUSATE SODIUM 8.6-50 MG PO TABS
2.0000 | ORAL_TABLET | ORAL | Status: DC
  Administered 2020-09-20 – 2020-09-21 (×2): 2 via ORAL
  Filled 2020-09-20 (×2): qty 2

## 2020-09-20 MED ORDER — NALOXONE HCL 4 MG/10ML IJ SOLN
1.0000 ug/kg/h | INTRAVENOUS | Status: DC | PRN
  Filled 2020-09-20: qty 5

## 2020-09-20 MED ORDER — MORPHINE SULFATE (PF) 0.5 MG/ML IJ SOLN
INTRAMUSCULAR | Status: DC | PRN
  Administered 2020-09-20: 150 ug via INTRATHECAL

## 2020-09-20 MED ORDER — KETOROLAC TROMETHAMINE 30 MG/ML IJ SOLN: 30.0000 mg | Freq: Four times a day (QID) | INTRAMUSCULAR | Status: AC | PRN

## 2020-09-20 MED ORDER — OXYTOCIN-SODIUM CHLORIDE 30-0.9 UT/500ML-% IV SOLN
INTRAVENOUS | Status: AC
  Filled 2020-09-20: qty 500

## 2020-09-20 MED ORDER — SODIUM CHLORIDE 0.9 % IR SOLN
Status: DC | PRN
  Administered 2020-09-20: 1

## 2020-09-20 MED ORDER — DIBUCAINE (PERIANAL) 1 % EX OINT: 1.0000 "application " | TOPICAL_OINTMENT | CUTANEOUS | Status: DC | PRN

## 2020-09-20 MED ORDER — CEFAZOLIN SODIUM-DEXTROSE 2-4 GM/100ML-% IV SOLN
INTRAVENOUS | Status: AC
  Filled 2020-09-20: qty 100

## 2020-09-20 MED ORDER — OXYTOCIN-SODIUM CHLORIDE 30-0.9 UT/500ML-% IV SOLN
INTRAVENOUS | Status: DC | PRN
  Administered 2020-09-20: 30 [IU] via INTRAVENOUS

## 2020-09-20 MED ORDER — ACETAMINOPHEN 500 MG PO TABS
1000.0000 mg | ORAL_TABLET | Freq: Three times a day (TID) | ORAL | Status: DC
  Administered 2020-09-20 – 2020-09-21 (×2): 1000 mg via ORAL
  Filled 2020-09-20 (×2): qty 2

## 2020-09-20 MED ORDER — PHENYLEPHRINE HCL-NACL 20-0.9 MG/250ML-% IV SOLN
INTRAVENOUS | Status: AC
  Filled 2020-09-20: qty 250

## 2020-09-20 SURGICAL SUPPLY — 34 items
BARRIER ADHS 3X4 INTERCEED (GAUZE/BANDAGES/DRESSINGS) IMPLANT
BRR ADH 4X3 ABS CNTRL BYND (GAUZE/BANDAGES/DRESSINGS)
CLAMP CORD UMBIL (MISCELLANEOUS) IMPLANT
CLOTH BEACON ORANGE TIMEOUT ST (SAFETY) ×2 IMPLANT
DRSG OPSITE POSTOP 4X10 (GAUZE/BANDAGES/DRESSINGS) ×2 IMPLANT
ELECT REM PT RETURN 9FT ADLT (ELECTROSURGICAL) ×2
ELECTRODE REM PT RTRN 9FT ADLT (ELECTROSURGICAL) ×1 IMPLANT
EXTRACTOR VACUUM KIWI (MISCELLANEOUS) IMPLANT
GLOVE BIO SURGEON STRL SZ 6.5 (GLOVE) ×2 IMPLANT
GLOVE BIOGEL PI IND STRL 7.0 (GLOVE) ×2 IMPLANT
GLOVE BIOGEL PI INDICATOR 7.0 (GLOVE) ×2
GOWN STRL REUS W/TWL LRG LVL3 (GOWN DISPOSABLE) ×4 IMPLANT
HEMOSTAT ARISTA ABSORB 3G PWDR (HEMOSTASIS) ×1 IMPLANT
KIT ABG SYR 3ML LUER SLIP (SYRINGE) IMPLANT
NDL HYPO 25X5/8 SAFETYGLIDE (NEEDLE) IMPLANT
NEEDLE HYPO 22GX1.5 SAFETY (NEEDLE) IMPLANT
NEEDLE HYPO 25X5/8 SAFETYGLIDE (NEEDLE) IMPLANT
NS IRRIG 1000ML POUR BTL (IV SOLUTION) ×2 IMPLANT
PACK C SECTION WH (CUSTOM PROCEDURE TRAY) ×2 IMPLANT
PAD OB MATERNITY 4.3X12.25 (PERSONAL CARE ITEMS) ×2 IMPLANT
PENCIL SMOKE EVAC W/HOLSTER (ELECTROSURGICAL) ×2 IMPLANT
RTRCTR C-SECT PINK 25CM LRG (MISCELLANEOUS) IMPLANT
SPONGE LAP 18X18 X RAY DECT (DISPOSABLE) ×1 IMPLANT
SUT MNCRL 0 VIOLET CTX 36 (SUTURE) IMPLANT
SUT MONOCRYL 0 CTX 36 (SUTURE) ×2
SUT PLAIN 2 0 XLH (SUTURE) ×1 IMPLANT
SUT VIC AB 0 CT1 36 (SUTURE) ×12 IMPLANT
SUT VIC AB 2-0 CT1 27 (SUTURE) ×2
SUT VIC AB 2-0 CT1 TAPERPNT 27 (SUTURE) ×1 IMPLANT
SUT VIC AB 4-0 PS2 27 (SUTURE) ×2 IMPLANT
SYR CONTROL 10ML LL (SYRINGE) IMPLANT
TOWEL OR 17X24 6PK STRL BLUE (TOWEL DISPOSABLE) ×2 IMPLANT
TRAY FOLEY W/BAG SLVR 14FR LF (SET/KITS/TRAYS/PACK) IMPLANT
WATER STERILE IRR 1000ML POUR (IV SOLUTION) ×2 IMPLANT

## 2020-09-20 NOTE — Anesthesia Postprocedure Evaluation (Signed)
Anesthesia Post Note  Patient: Allison Pratt  Procedure(s) Performed: CESAREAN SECTION WITH BILATERAL TUBAL LIGATION (Bilateral )     Patient location during evaluation: PACU Anesthesia Type: Spinal Level of consciousness: oriented and awake and alert Pain management: pain level controlled Vital Signs Assessment: post-procedure vital signs reviewed and stable Respiratory status: spontaneous breathing, respiratory function stable and nonlabored ventilation Cardiovascular status: blood pressure returned to baseline and stable Postop Assessment: no headache, no backache, no apparent nausea or vomiting, epidural receding and patient able to bend at knees Anesthetic complications: no   No complications documented.  Last Vitals:  Vitals:   09/20/20 1845 09/20/20 1900  BP: (!) 97/52 (!) 89/64  Pulse: 77 80  Resp: 19 18  Temp: 36.5 C   SpO2: 100% 100%    Last Pain:  Vitals:   09/20/20 1900  TempSrc:   PainSc: 0-No pain   Pain Goal:    LLE Motor Response: Purposeful movement (09/20/20 1900) LLE Sensation: Numbness,Tingling (09/20/20 1900) RLE Motor Response: Purposeful movement (09/20/20 1900) RLE Sensation: Numbness,Tingling (09/20/20 1900)     Epidural/Spinal Function Cutaneous sensation: Tingles (09/20/20 1900), Patient able to flex knees: No (09/20/20 1900), Patient able to lift hips off bed: No (09/20/20 1900), Back pain beyond tenderness at insertion site: No (09/20/20 1900), Progressively worsening motor and/or sensory loss: No (09/20/20 1900), Bowel and/or bladder incontinence post epidural: No (09/20/20 1900)  Cordarius Benning A.

## 2020-09-20 NOTE — Anesthesia Procedure Notes (Signed)
Spinal  Patient location during procedure: OR Start time: 09/20/2020 5:10 PM End time: 09/20/2020 5:15 PM Staffing Anesthesiologist: Janeece Riggers, MD Preanesthetic Checklist Completed: patient identified, IV checked, site marked, risks and benefits discussed, surgical consent, monitors and equipment checked, pre-op evaluation and timeout performed Spinal Block Patient position: sitting Prep: DuraPrep Patient monitoring: heart rate, cardiac monitor, continuous pulse ox and blood pressure Approach: midline Location: L3-4 Injection technique: single-shot Needle Needle type: Sprotte  Needle gauge: 24 G Needle length: 9 cm Assessment Sensory level: T4

## 2020-09-20 NOTE — Progress Notes (Signed)
Pt arrived to OR via wheelchair at 1633 for procedure. At 1639, RN notified by Dr. Dione Plover to not begin spinal procedure due to another situation on the labor floor. Patient moved from sitting up on OR table to wheelchair. At ~1705 ANMD in OR with okay from MD to proceed for spinal and prep for surgery.

## 2020-09-20 NOTE — Progress Notes (Signed)
Orders already placed for cesarean, added azithromycin as well.

## 2020-09-20 NOTE — Telephone Encounter (Signed)
Preadmission screen  

## 2020-09-20 NOTE — Discharge Summary (Signed)
Postpartum Discharge Summary    Patient Name: Allison Pratt DOB: 17-Jan-1986 MRN: [id number]  Date of admission: 09/20/2020 Delivery date:09/20/2020  Delivering provider: Clarnce Flock  Date of discharge: 09/22/2020  Admitting diagnosis: H/O cesarean section [Z98.891] Cesarean delivery delivered [O82] Intrauterine pregnancy: [redacted]w[redacted]d    Secondary diagnosis:  Active Problems:   H/O cesarean section   Cesarean delivery delivered   Acute blood loss anemia   History of bilateral tubal ligation  Additional problems: none    Discharge diagnosis: Term Pregnancy Delivered                                              Post partum procedures:postpartum tubal ligation Augmentation: N/A Complications: None  Hospital course: Sceduled C/S   35y.o. yo [id number] [id number] admitted to the hospital 09/20/2020 for scheduled cesarean section with the following indication:Elective Repeat and PROM.Delivery details are as follows:  Membrane Rupture Time/Date: 8:00 AM ,09/17/2020   Delivery Method:C-Section, Low Transverse  Details of operation can be found in separate operative note.  Patient had an uncomplicated postpartum course.  She is ambulating, tolerating a regular diet, passing flatus, and urinating well. Patient is discharged home in stable condition on  09/22/20        Newborn Data: Birth date:09/20/2020  Birth time:5:43 PM  Gender:Female  Living status:Living  Apgars:8 ,9  Weight:2580 g     Magnesium Sulfate received: No BMZ received: No Rhophylac:N/A MMR:N/A T-DaP:Given prenatally Flu: Yes Transfusion:No  Physical exam  Vitals:   09/21/20 1343 09/21/20 1730 09/21/20 2053 09/22/20 0544  BP: (!) 112/55 121/80 (!) 104/56 115/67  Pulse: 80 67 74 71  Resp: '18 18 15 18  ' Temp: 98.3 F (36.8 C) (!) 97.5 F (36.4 C) 98.4 F (36.9 C) 98.3 F (36.8 C)  TempSrc: Axillary Axillary Oral Oral  SpO2:  98% 100% 100%  Weight:      Height:       General: alert, cooperative and no  distress Lochia: appropriate Uterine Fundus: firm Incision: Healing well with no significant drainage DVT Evaluation: No evidence of DVT seen on physical exam. Labs: Lab Results  Component Value Date   WBC 17.0 (H) 09/21/2020   HGB 7.6 (L) 09/21/2020   HCT 23.8 (L) 09/21/2020   MCV 97.9 09/21/2020   PLT 203 09/21/2020   CMP Latest Ref Rng & Units 09/21/2020  Glucose 70 - 99 mg/dL -  BUN 6 - 23 mg/dL -  Creatinine 0.44 - 1.00 mg/dL 0.75  Sodium 135 - 145 mEq/L -  Potassium 3.5 - 5.1 mEq/L -  Chloride 96 - 112 mEq/L -  CO2 19 - 32 mEq/L -  Calcium 8.4 - 10.5 mg/dL -   Edinburgh Score: Edinburgh Postnatal Depression Scale Screening Tool 09/20/2020  I have been able to laugh and see the funny side of things. 0  I have looked forward with enjoyment to things. 0  I have blamed myself unnecessarily when things went wrong. 0  I have been anxious or worried for no good reason. 0  I have felt scared or panicky for no good reason. 0  Things have been getting on top of me. 0  I have been so unhappy that I have had difficulty sleeping. 0  I have felt sad or miserable. 0  I have been so unhappy that I have  been crying. 0  The thought of harming myself has occurred to me. 0  Edinburgh Postnatal Depression Scale Total 0     After visit meds:  Allergies as of 09/22/2020   No Known Allergies     Medication List    STOP taking these medications   ampicillin 500 MG capsule Commonly known as: PRINCIPEN     TAKE these medications   acetaminophen 500 MG tablet Commonly known as: TYLENOL Take 2 tablets (1,000 mg total) by mouth every 6 (six) hours as needed for mild pain or moderate pain.   Blood Pressure Monitor Misc For regular home bp monitoring during pregnancy   coconut oil Oil Apply 1 application topically as needed.   ferrous sulfate 325 (65 FE) MG tablet Take 1 tablet (325 mg total) by mouth 2 (two) times daily with a meal.   ibuprofen 600 MG tablet Commonly known as:  ADVIL Take 1 tablet (600 mg total) by mouth every 6 (six) hours.   multivitamin-prenatal 27-0.8 MG Tabs tablet Take 1 tablet by mouth daily at 12 noon.   oxyCODONE 5 MG immediate release tablet Commonly known as: Oxy IR/ROXICODONE Take 1 tablet (5 mg total) by mouth every 4 (four) hours as needed for moderate pain.   polyethylene glycol powder 17 GM/SCOOP powder Commonly known as: MiraLax Take 255 g by mouth once for 1 dose.            Discharge Care Instructions  (From admission, onward)         Start     Ordered   09/22/20 0000  Discharge wound care:       Comments: Take bandage off 5-7 days after surgery, or when it starts peeling off.   09/22/20 0850           Discharge home in stable condition Infant Feeding: Bottle Infant Disposition:home with mother Discharge instruction: per After Visit Summary and Postpartum booklet. Activity: Advance as tolerated. Pelvic rest for 6 weeks.  Diet: routine diet Future Appointments: Future Appointments  Date Time Provider [place]  10/02/2020 10:00 AM Florian Buff, MD [facility]  10/26/2020  1:30 PM Cresenzo-Dishmon, Joaquim Lai, CNM [facility]   Follow up Visit:   Please schedule this patient for a In person postpartum visit in 6 weeks with the following provider: Any provider. Additional Postpartum F/U:Incision check 1 week  High risk pregnancy complicated by: repeat c section Delivery mode:  C-Section, Low Transverse  Anticipated Birth Control:  BTL done [facility]   09/22/2020 Janet Berlin, MD

## 2020-09-20 NOTE — Progress Notes (Signed)
   LOW-RISK PREGNANCY VISIT Patient name: Allison Pratt MRN [id number]  Date of birth: 1985/10/28 Chief Complaint:   Routine Prenatal Visit  History of Present Illness:   Allison Pratt is a 35 y.o. G54P1021 female at [redacted]w[redacted]d with an Estimated Date of Delivery: 10/06/20 being seen today for ongoing management of a low-risk pregnancy.  Depression screen [facility] 2/9 07/05/2020 03/29/2020 02/02/2020  Decreased Interest 0 1 1  Down, Depressed, Hopeless 2 0 0  PHQ - 2 Score 2 1 1   Altered sleeping 0 0 -  Tired, decreased energy 0 2 -  Change in appetite 0 1 -  Feeling bad or failure about yourself  1 0 -  Trouble concentrating 0 0 -  Moving slowly or fidgety/restless 0 0 -  Suicidal thoughts 0 0 -  PHQ-9 Score 3 4 -    Pt was initially a virtual visit, but reported continued lof, so brought in for in-person visit. Underwear completely soaked w/ fluid on arrival.  Today she reports leaking fluid since 1/11, had visit 1/13 w/ no evidence of ROM, started leaking worse on Sunday 1/16 around 12pm. Has been having to wear depends and towels. . Contractions: Not present. Vag. Bleeding: None.  Movement: Present. reports leaking of fluid. Review of Systems:   Pertinent items are noted in HPI Denies abnormal vaginal discharge w/ itching/odor/irritation, headaches, visual changes, shortness of breath, chest pain, abdominal pain, severe nausea/vomiting, or problems with urination or bowel movements unless otherwise stated above. Pertinent History Reviewed:  Reviewed past medical,surgical, social, obstetrical and family history.  Reviewed problem list, medications and allergies. Physical Assessment:   Vitals:   09/20/20 1231  BP: 102/69  Pulse: (!) 101  Weight: 194 lb (88 kg)  Body mass index is 34.37 kg/m.        Physical Examination:   General appearance: Well appearing, and in no distress  Mental status: Alert, oriented to person, place, and time  Skin: Warm & dry  Cardiovascular: Normal heart rate  noted  Respiratory: Normal respiratory effort, no distress  Abdomen: Soft, gravid, nontender  Pelvic: SSE: cx visually closed, no pooling, no change w/ valsalva. Fern neg. Waited until pt had further leakage- obtained slide from fluid on perineum- fern + . Fluid clear        Extremities: Edema: None  Fetal Status: Fetal Heart Rate (bpm): 133 Fundal Height: 36 cm Movement: Present    Chaperone: Celene Squibb   No results found for this or any previous visit (from the past 24 hour(s)).  Assessment & Plan:  1) Low-risk pregnancy G4P1021 at [redacted]w[redacted]d with an Estimated Date of Delivery: 10/06/20   2) Prolonged ROM, no labor or s/s infection. For RCS w/ BTL. Last solid @ 0800, juice @ 1130. NPO now. Notified Dr. Dione Plover. Pt to go straight to [facility] for RCS w/ BTL.    Meds: No orders of the defined types were placed in this encounter.  Labs/procedures today: spec exam, fern  Follow-up: Return for 1wk incision check.  Future Appointments  Date Time Provider [place]  09/27/2020  9:30 AM MC-LD PAT 1 MC-INDC None  09/27/2020 10:30 AM MC-SCREENING MC-SDSC None  10/02/2020 10:00 AM E[place], Mertie Clause, MD [facility]    No orders of the defined types were placed in this encounter.  [place], [facility] 09/20/2020 2:02 PM

## 2020-09-20 NOTE — H&P (Signed)
Obstetric Preoperative History and Physical  Allison Pratt is a 35 y.o. A2N0539 with IUP at [redacted]w[redacted]d presenting for repeat cesarean section in setting of PROM, hx of one prior CS.  Reports good fetal movement, no bleeding, no contractions. Broke her water at 8am today.  No acute preoperative concerns.    Cesarean Section Indication: patient declines vag del attempt  Prenatal Course [facility]    Pregnancy complications or risks: Patient Active Problem List   Diagnosis Date Noted  . H/O cesarean section 09/20/2020  . Previous cesarean delivery affecting pregnancy 07/05/2020  . Request for sterilization 05/10/2020  . Marijuana use 03/31/2020  . Encounter for supervision of normal pregnancy, antepartum 03/29/2020  . History of ectopic pregnancy 02/02/2020   She plans to bottle feed She desires bilateral tubal ligation for postpartum contraception.   Prenatal labs and studies: ABO, Rh: O/Positive/-- (07/28 1038) Antibody: Negative (11/03 0842) Rubella: 1.68 (07/28 1038) RPR: Non Reactive (11/03 0842)  HBsAg: Negative (07/28 1038)  HIV: Non Reactive (11/03 0842)  JQB:HALPFXTK/-- (01/13 1351) 1 hr Glucola  normal Genetic screening normal Anatomy US normal  Prenatal Transfer Tool  Maternal Diabetes: No Genetic Screening: Normal Maternal Ultrasounds/Referrals: Normal Fetal Ultrasounds or other Referrals:  None Maternal Substance Abuse:  No Significant Maternal Medications:  None Significant Maternal Lab Results: Group B Strep positive  Past Medical History:  Diagnosis Date  . Infection of hand 04/04/2012    Past Surgical History:  Procedure Laterality Date  . DILATION AND CURETTAGE OF UTERUS    . ECTOPIC PREGNANCY SURGERY Left   . I & D EXTREMITY  04/03/2012   Procedure: IRRIGATION AND DEBRIDEMENT EXTREMITY;  Surgeon: Schuyler Amor, MD;  Location: [place];  Service: Orthopedics;  Laterality: Left;    OB History  Gravida Para Term Preterm AB Living  4 1  1   2 1   SAB IAB Ectopic Multiple Live Births  1   1   1     # Outcome Date GA Lbr Len/2nd Weight Sex Delivery Anes PTL Lv  4 Current           3 Term 01/24/14 [redacted]w[redacted]d  2807 g M CS-LTranv EPI  LIV     Complications: Fetal Intolerance  2 Ectopic           1 SAB             Social History   Socioeconomic History  . Marital status: Married    Spouse name: Not on file  . Number of children: Not on file  . Years of education: Not on file  . Highest education level: Not on file  Occupational History  . Not on file  Tobacco Use  . Smoking status: Former Smoker    Packs/day: 0.25  . Smokeless tobacco: Never Used  Vaping Use  . Vaping Use: Never used  Substance and Sexual Activity  . Alcohol use: No  . Drug use: No  . Sexual activity: Yes    Birth control/protection: None  Other Topics Concern  . Not on file  Social History Narrative  . Not on file   Social Determinants of Health   Financial Resource Strain: Low Risk   . Difficulty of Paying Living Expenses: Not hard at all  Food Insecurity: No Food Insecurity  . Worried About Charity fundraiser in the Last Year: Never true  . Ran Out of Food in the Last Year: Never true  Transportation Needs: No Transportation Needs  .  Lack of Transportation (Medical): No  . Lack of Transportation (Non-Medical): No  Physical Activity: Insufficiently Active  . Days of Exercise per Week: 1 day  . Minutes of Exercise per Session: 20 min  Stress: No Stress Concern Present  . Feeling of Stress : Only a little  Social Connections: Moderately Integrated  . Frequency of Communication with Friends and Family: More than three times a week  . Frequency of Social Gatherings with Friends and Family: Twice a week  . Attends Religious Services: More than 4 times per year  . Active Member of Clubs or Organizations: No  . Attends Archivist Meetings: Never  . Marital Status: Married    Family History  Problem Relation Age of Onset  .  Diabetes Maternal Grandmother   . Diabetes Maternal Grandfather     Medications Prior to Admission  Medication Sig Dispense Refill Last Dose  . ampicillin (PRINCIPEN) 500 MG capsule Take 1 capsule (500 mg total) by mouth 3 (three) times daily. 21 capsule 0   . Blood Pressure Monitor MISC For regular home bp monitoring during pregnancy 1 each 0   . ferrous sulfate 325 (65 FE) MG tablet Take 1 tablet (325 mg total) by mouth 2 (two) times daily with a meal. 60 tablet 3   . Prenatal Vit-Fe Fumarate-FA (MULTIVITAMIN-PRENATAL) 27-0.8 MG TABS tablet Take 1 tablet by mouth daily at 12 noon.       No Known Allergies  Review of Systems: Pertinent items noted in HPI and remainder of comprehensive ROS otherwise negative.  Physical Exam: LMP 12/31/2019 (Within Days)  FHR by Doppler: 145 bpm CONSTITUTIONAL: Well-developed, well-nourished female in no acute distress.  HENT:  Normocephalic, atraumatic  EYES: Conjunctivae and EOM are normal.  NECK: Normal range of motion, supple, no masses SKIN: Skin is warm and dry.  [place]: Alert and oriented to person, place, and time.  PSYCHIATRIC: Normal mood and affect.  CARDIOVASCULAR: Normal heart rate noted, regular rhythm RESPIRATORY: Effort normal, no problems with respiration noted ABDOMEN: Soft, nontender, nondistended, gravid. Well-healed Pfannenstiel incision. PELVIC: Deferred MUSCULOSKELETAL: No edema and no tenderness.    Pertinent Labs/Studies:   No results found for this or any previous visit (from the past 72 hour(s)).  Assessment and Plan: Allison Pratt is a 35 y.o. [id number] at [redacted]w[redacted]d being admitted for C section in setting of PROM, hx of prior CS. The risks of surgery were discussed with the patient including but were not limited to: bleeding which may require transfusion or reoperation; infection which may require antibiotics; injury to bowel, bladder, ureters or other surrounding organs; injury to the fetus; need for additional procedures  including hysterectomy in the event of a life-threatening hemorrhage; formation of adhesions; placental abnormalities wth subsequent pregnancies; incisional problems; thromboembolic phenomenon and other postoperative/anesthesia complications. The patient concurred with the proposed plan, giving informed written consent for the procedure. Patient has been NPO since last night she will remain NPO for procedure. Anesthesia and OR aware. Preoperative prophylactic antibiotics and SCDs ordered on call to the OR.    Patient also desires permanent sterilization.  Other reversible forms of contraception were discussed with patient; she declines all other modalities. Risks of procedure discussed with patient including but not limited to: risk of regret, permanence of method, bleeding, infection, injury to surrounding organs and need for additional procedures.  Failure risk of about 1% with increased risk of ectopic gestation if pregnancy occurs was also discussed with patient.  Also discussed possibility of post-tubal pain  syndrome. Patient verbalized understanding of these risks and wants to proceed with sterilization.  Written informed consent obtained.  To OR when ready.  Sharene Skeans, MD [facility] Family Medicine Fellow, [facility] for [facility], [place]

## 2020-09-20 NOTE — Progress Notes (Signed)
Pt initially mychart visit, reports heavy leaking of fluid. To come in for visit now.  This encounter was created in error - please disregard.

## 2020-09-20 NOTE — Progress Notes (Signed)
Pt wasn't at home at time of visit but stated that she will check a blood pressure as soon as she gets there and message it to [place].

## 2020-09-20 NOTE — Patient Instructions (Signed)
Allison Pratt  09/20/2020   Your procedure is scheduled on:  09/29/2020  Arrive at 2 at Entrance C on [address] at [facility]  and [employer]. You are invited to use the FREE valet parking or use the Visitor's parking deck.  Pick up the phone at the desk and dial [phone].  Call this number if you have problems the morning of surgery: [phone]  Remember:   Do not eat food:(After Midnight) Desps de medianoche.  Do not drink clear liquids: (After Midnight) Desps de medianoche.  Take these medicines the morning of surgery with A SIP OF WATER:  none   Do not wear jewelry, make-up or nail polish.  Do not wear lotions, powders, or perfumes. Do not wear deodorant.  Do not shave 48 hours prior to surgery.  Do not bring valuables to the hospital.  [facility] is not   responsible for any belongings or valuables brought to the hospital.  Contacts, dentures or bridgework may not be worn into surgery.  Leave suitcase in the car. After surgery it may be brought to your room.  For patients admitted to the hospital, checkout time is 11:00 AM the day of              discharge.      Please read over the following fact sheets that you were given:     Preparing for Surgery

## 2020-09-20 NOTE — Anesthesia Preprocedure Evaluation (Addendum)
Anesthesia Evaluation  Patient identified by MRN, date of birth, ID band Patient awake    Reviewed: Allergy & Precautions, H&P , NPO status , Patient's Chart, lab work & pertinent test results  Airway Mallampati: II  TM Distance: >3 FB Neck ROM: Full    Dental no notable dental hx. (+) Teeth Intact, Dental Advisory Given   Pulmonary neg pulmonary ROS, former smoker,    Pulmonary exam normal breath sounds clear to auscultation       Cardiovascular negative cardio ROS Normal cardiovascular exam Rhythm:Regular Rate:Normal     Neuro/Psych negative neurological ROS  negative psych ROS   GI/Hepatic negative GI ROS, (+)     substance abuse  marijuana use,   Endo/Other  Morbid obesity  Renal/GU negative Renal ROS     Musculoskeletal negative musculoskeletal ROS (+)   Abdominal (+) + obese,   Peds  Hematology negative hematology ROS (+)   Anesthesia Other Findings   Reproductive/Obstetrics negative OB ROS                            Anesthesia Physical  Anesthesia Plan  ASA: II and emergent  Anesthesia Plan: Spinal   Post-op Pain Management:    Induction: Intravenous, Rapid sequence and Cricoid pressure planned  PONV Risk Score and Plan: 3 and Scopolamine patch - Pre-op  Airway Management Planned: Oral ETT and Natural Airway  Additional Equipment: None  Intra-op Plan:   Post-operative Plan:   Informed Consent: I have reviewed the patients History and Physical, chart, labs and discussed the procedure including the risks, benefits and alternatives for the proposed anesthesia with the patient or authorized representative who has indicated his/her understanding and acceptance.     Dental advisory given  Plan Discussed with: CRNA, Surgeon and Anesthesiologist  Anesthesia Plan Comments:         Anesthesia Quick Evaluation

## 2020-09-20 NOTE — Op Note (Addendum)
Operative Note   SURGERY DATE: 09/20/2020  PRE-OP DIAGNOSIS:  *Pregnancy at [redacted]w[redacted]d * history of prior cesarean section * history of right sided salpingectomy * desires infertility   POST-OP DIAGNOSIS: same    PROCEDURE: Repeat low transverse cesarean section via pfannenstiel skin incision with left sided tubal ligation with double layer uterine closure  SURGEON: Surgeon(s) and Role:    * Clarnce Flock, MD - Primary    * Griffin Basil, MD - Assisting    * Marsala, Placido Sou, MD - Fellow  ANESTHESIA: spinal  ESTIMATED BLOOD LOSS: 900 ccs   DRAINS: 319mL UOP via indwelling foley  TOTAL IV FLUIDS: 2489mL crystalloid  VTE PROPHYLAXIS: SCDs to bilateral lower extremities  ANTIBIOTICS: 2g ancef, 500mg  azithromycin, within 1 hour of skin incision  SPECIMENS: placenta   COMPLICATIONS: none  INDICATIONS: as above, history of prior cesarean    FINDINGS: Moderate intra-abdominal adhesions were noted with severe adhesion of peritoneum and bladder to lower uterine segment. Otherwise grossly normal uterus, absent right fallopian tube, normal left fallopian tube, and ovaries. Clear amniotic fluid, cephalic female infant, weight pending, APGARs 8/9, intact placenta.  PROCEDURE IN DETAIL: The patient was taken to the operating room where spinal anesthesia was administered and normal fetal heart tones were confirmed. She was then prepped and draped in the normal fashion in the dorsal supine position with a leftward tilt.  After a time out was performed, a pfannensteil skin incision was made with the scalpel and carried through to the underlying layer of fascia. The fascia was then incised at the midline and this incision was extended laterally bluntly. Attention was turned to the superior aspect of the fascial incision which was grasped with the kocher clamps x 2, tented up and the rectus muscles were dissected off with the mayo scissors. In a similar fashion the inferior aspect of the  fascial incision was grasped with the kocher clamps, tented up and the rectus muscles dissected off with the mayo scissors. The rectus muscles were then separated in the midline and the peritoneum was entered bluntly. At this time dense adhesions of the omentum and bladder to the lower uterine segment were noted and we were unable to place an [place] due to these adhesions. Dr. Elgie Congo called to assist in surgery. Decision made to proceed with a high transverse hysterotomy.    A high transverse hysterotomy was made with the scalpel until the endometrial cavity was breached. Incision was extended in a J fashion on the right side with bandage scissors. Interestingly despite leaking fluid for days and being fern positive in the office, the amniotic sac was bulging through the hysterotomy and was subsequently ruptured with the Allis clamp, yielding clear amniotic fluid. The infant's head, shoulders and body were delivered atraumatically.The cord was clamped x 2 and cut, and the infant was handed to the awaiting pediatricians, after delayed cord clamping was done. The placenta was then gradually expressed from the uterus and then the uterus was cleared of all clots and debris. The hysterotomy was repaired with a running suture of 1-0 monocryl. A second imbricating layer of 1-0 monocryl suture was then placed. Several figure-of-eight sutures of 1-0 Monocryl were added to achieve excellent hemostasis.   Filshie Clips:  The left Fallopian tube was identified by tracing out to the fimbraie, grasped with the Babcock clamps. An avascular midsection of the tube approximately 3-4cm from the cornua was grasped with the babcock clamps and the filshie clip was applied, taking care to  incorporate the entire tube. Patient has history of salpingectomy on right side - we confirmed absence of right fallopian tube intra-operatively.   The hysterotomy and all operative sites were reinspected and excellent hemostasis was noted after  irrigation and suction of the abdomen with warm saline. Arrysta placed on hysterotomy site. The peritoneal and rectus muscles were then inspected and found to be hemostatic.  The peritoneum was not closed. The fascia was reapproximated with 0 Vicryl in a simple running fashion. The subcutaneous layer was then reapproximated with running sutures of 2-0 plain gut, and the skin was then closed with 4-0 Vicryl, in a subcuticular fashion. Honeycomb and pressure dressing applied.   The patient  tolerated the procedure well. Sponge, lap, needle, and instrument counts were correct x 2. The patient was transferred to the recovery room awake, alert and breathing independently in stable condition.   Sharene Skeans, MD OB Family Medicine Fellow, [facility] for [facility], [place] of Attending Supervision of Obstetric Fellow During Surgery: An experienced assistant was required given the standard of surgical care given the complexity of the case.  This assistant was needed for exposure, dissection, suctioning, retraction, instrument exchange, assisting with delivery with administration of fundal pressure, and for overall help during the procedure. Surgery was performed with the Obstetric Fellow under my supervision and collaboration.  I was present and scrubbed for the entire procedure.   I have reviewed the Obstetric Fellow's operative report, and I agree with the documentation. I have also made any necessary editorial changes.  Clarnce Flock, MD/MPH Attending Family Medicine Physician, [facility] for [facility], [place]

## 2020-09-20 NOTE — Transfer of Care (Signed)
Immediate Anesthesia Transfer of Care Note  Patient: Allison Pratt  Procedure(s) Performed: CESAREAN SECTION WITH BILATERAL TUBAL LIGATION (Bilateral )  Patient Location: PACU  Anesthesia Type:Spinal  Level of Consciousness: awake, alert  and oriented  Airway & Oxygen Therapy: Patient Spontanous Breathing  Post-op Assessment: Report given to RN and Post -op Vital signs reviewed and stable  Post vital signs: Reviewed and stable  Last Vitals:  Vitals Value Taken Time  BP    Temp    Pulse    Resp    SpO2      Last Pain:  Vitals:   09/20/20 1501  TempSrc: Oral         Complications: No complications documented.

## 2020-09-21 ENCOUNTER — Encounter (HOSPITAL_COMMUNITY): Payer: Self-pay | Admitting: Family Medicine

## 2020-09-21 DIAGNOSIS — Z9851 Tubal ligation status: Secondary | ICD-10-CM

## 2020-09-21 DIAGNOSIS — D62 Acute posthemorrhagic anemia: Secondary | ICD-10-CM | POA: Diagnosis not present

## 2020-09-21 LAB — CREATININE, SERUM
Creatinine, Ser: 0.75 mg/dL (ref 0.44–1.00)
GFR, Estimated: >60 mL/min (ref >60)

## 2020-09-21 LAB — CBC
HCT: 23.8 % — ABNORMAL LOW (ref 36.0–46.0)
Hemoglobin: 7.6 g/dL — ABNORMAL LOW (ref 12.0–15.0)
MCH: 31.3 pg (ref 26.0–34.0)
MCHC: 31.9 g/dL (ref 30.0–36.0)
MCV: 97.9 fL (ref 80.0–100.0)
Platelets: 203 10*3/uL (ref 150–400)
RBC: 2.43 MIL/uL — ABNORMAL LOW (ref 3.87–5.11)
RDW: 14.8 % (ref 11.5–15.5)
WBC: 17 10*3/uL — ABNORMAL HIGH (ref 4.0–10.5)
nRBC: 0 % (ref 0.0–0.2)

## 2020-09-21 LAB — RPR: RPR Ser Ql: NONREACTIVE

## 2020-09-21 MED ORDER — IBUPROFEN 800 MG PO TABS
800.0000 mg | ORAL_TABLET | Freq: Four times a day (QID) | ORAL | Status: DC
  Administered 2020-09-21 – 2020-09-22 (×3): 800 mg via ORAL
  Filled 2020-09-21 (×3): qty 1

## 2020-09-21 MED ORDER — ACETAMINOPHEN 500 MG PO TABS
1000.0000 mg | ORAL_TABLET | Freq: Four times a day (QID) | ORAL | Status: DC | PRN
  Administered 2020-09-21 (×3): 1000 mg via ORAL
  Filled 2020-09-21 (×3): qty 2

## 2020-09-21 MED ORDER — SODIUM CHLORIDE 0.9 % IV SOLN
500.0000 mg | Freq: Once | INTRAVENOUS | Status: AC
  Administered 2020-09-21: 500 mg via INTRAVENOUS
  Filled 2020-09-21: qty 25

## 2020-09-21 NOTE — Social Work (Signed)
CSW received consult for hx of marijuana use.  Referral was screened out due to the following:  ~MOB had no documented substance use after initial prenatal visit/+UPT. ~MOB had no positive drug screens after initial prenatal visit/+UPT. ~Baby's UDS is negative.  Please contact the Clinical Social Worker if needs arise, by [facility] request, or if MOB scores greater than 9/yes to question 10 on Edinburgh Postpartum Depression Screen.  CSW will monitor CDS results and make a report to Child Protective Services if warranted.  Darra Lis, MSW, [employer] and [employer] [phone]

## 2020-09-21 NOTE — Progress Notes (Addendum)
Post Operative Day 1 Subjective: Allison Pratt is POD#1 s/p rLTCS & BTL and reports overall feeling well. She reports that she has some discomfort at her incision site that she notes especially when she moves. She has been able to move around and has passed flatus. Foley catheter still in place. She reports very little vaginal bleeding.   Objective: Blood pressure 107/63, pulse 72, temperature 98.4 F (36.9 C), temperature source Oral, resp. rate 16, height 5\' 3"  (1.6 m), weight 88 kg, last menstrual period 12/31/2019, SpO2 97 %.  Physical Exam:  General: alert and no distress  Heart: regular rate and rhythm  Lungs: clear to ausculation bilaterally, normal effort of breathing  Abdomen: nontender to palpation, normoactive bowel sounds  Lochia: appropriate Uterine Fundus: firm Incision: no bleeding or drainage noted on dressing  DVT Evaluation: No evidence of DVT seen on physical exam. No cords or calf tenderness. No significant calf/ankle edema. Pedal pulses +2 bilaterally.   Recent Labs    09/20/20 1503  HGB 10.7*  HCT 33.6*   Assessment/Plan:  POD#1 s/p rLTCS & BTL  Allison Pratt is POD#1 s/p rLTCS & BTL and is doing well. She has mild discomfort at the incision site and is being managed with acetaminophen and ketorolac. She plans on bottle feeding.   Acute blood loss anemia, post op hgb 10.7>7.6, venofer ordered  Contraception  BTL performed yesterday after c-section.   Marijuana Use During Pregnancy  Will have consult with social work prior to discharge.  Plan for discharge Saturday 1/22 given time of delivery.   LOS: 1 day   Cecilie Lowers 09/21/2020, 7:53 AM   GME ATTESTATION:  I saw and evaluated the patient. I agree with the findings and the plan of care as documented in the medical student's note.  Arrie Senate, MD OB Fellow, [place] for [place] 09/21/2020 8:21 AM

## 2020-09-22 MED ORDER — COCONUT OIL OIL: 1.0000 "application " | TOPICAL_OIL | 0 refills | Status: DC | PRN

## 2020-09-22 MED ORDER — IBUPROFEN 600 MG PO TABS: 600.0000 mg | ORAL_TABLET | Freq: Four times a day (QID) | ORAL | 0 refills | Status: DC

## 2020-09-22 MED ORDER — ACETAMINOPHEN 500 MG PO TABS: 1000.0000 mg | ORAL_TABLET | Freq: Four times a day (QID) | ORAL | 0 refills | Status: AC | PRN

## 2020-09-22 MED ORDER — OXYCODONE HCL 5 MG PO TABS: 5.0000 mg | ORAL_TABLET | ORAL | 0 refills | Status: DC | PRN

## 2020-09-22 MED ORDER — POLYETHYLENE GLYCOL 3350 17 GM/SCOOP PO POWD: 1.0000 | Freq: Once | ORAL | 0 refills | Status: AC

## 2020-09-27 ENCOUNTER — Encounter (HOSPITAL_COMMUNITY)
Admission: RE | Admit: 2020-09-27 | Discharge: 2020-09-27 | Disposition: A | Discharge status: Home / Self Care | Payer: BC Managed Care – PPO | Source: Ambulatory Visit | Attending: Obstetrics & Gynecology | Admitting: Obstetrics & Gynecology

## 2020-09-27 ENCOUNTER — Other Ambulatory Visit (HOSPITAL_COMMUNITY): Payer: BC Managed Care – PPO

## 2020-10-02 ENCOUNTER — Encounter: Payer: BC Managed Care – PPO | Admitting: Obstetrics & Gynecology

## 2020-10-02 ENCOUNTER — Encounter: Payer: Self-pay | Admitting: *Deleted

## 2020-10-26 ENCOUNTER — Telehealth (INDEPENDENT_AMBULATORY_CARE_PROVIDER_SITE_OTHER): Payer: BC Managed Care – PPO | Admitting: Advanced Practice Midwife

## 2020-10-26 ENCOUNTER — Encounter: Payer: Self-pay | Admitting: Advanced Practice Midwife

## 2020-10-26 ENCOUNTER — Other Ambulatory Visit: Payer: Self-pay

## 2020-10-26 NOTE — Progress Notes (Signed)
° °  Pt not on mychart, didn't answer phone.  Left message

## 2020-12-11 ENCOUNTER — Ambulatory Visit: Payer: BC Managed Care – PPO | Admitting: Obstetrics & Gynecology
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
## 2021-09-05 ENCOUNTER — Other Ambulatory Visit (HOSPITAL_COMMUNITY)
Admission: RE | Admit: 2021-09-05 | Discharge: 2021-09-05 | Disposition: A | Discharge status: Home / Self Care | Payer: BC Managed Care – PPO | Source: Ambulatory Visit | Attending: Obstetrics & Gynecology | Admitting: Obstetrics & Gynecology

## 2021-09-05 ENCOUNTER — Other Ambulatory Visit (INDEPENDENT_AMBULATORY_CARE_PROVIDER_SITE_OTHER): Payer: BC Managed Care – PPO

## 2021-09-05 ENCOUNTER — Other Ambulatory Visit: Payer: Self-pay

## 2021-09-05 DIAGNOSIS — Z113 Encounter for screening for infections with a predominantly sexual mode of transmission: Secondary | ICD-10-CM | POA: Insufficient documentation
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
## 2021-09-05 NOTE — Progress Notes (Signed)
Chart reviewed for nurse visit. Agree with plan of care.  Estill Dooms, NP 09/05/2021 10:31 AM

## 2021-09-06 ENCOUNTER — Other Ambulatory Visit: Payer: Self-pay | Admitting: Adult Health

## 2021-09-06 LAB — CERVICOVAGINAL ANCILLARY ONLY
Bacterial Vaginitis (gardnerella): NEGATIVE
Candida Glabrata: NEGATIVE
Candida Vaginitis: POSITIVE — AB
Chlamydia: NEGATIVE
Comment: NEGATIVE
Comment: NEGATIVE
Comment: NEGATIVE
Comment: NEGATIVE
Comment: NEGATIVE
Comment: NORMAL
Neisseria Gonorrhea: NEGATIVE
Trichomonas: NEGATIVE

## 2021-09-06 MED ORDER — FLUCONAZOLE 150 MG PO TABS: ORAL_TABLET | ORAL | 1 refills | Status: DC

## 2021-09-06 NOTE — Progress Notes (Signed)
+  yeast on vaginal swab will rx diflucan  

## 2022-10-23 DIAGNOSIS — H1032 Unspecified acute conjunctivitis, left eye: Secondary | ICD-10-CM | POA: Diagnosis not present

## 2023-09-24 ENCOUNTER — Ambulatory Visit: Payer: BC Managed Care – PPO | Admitting: Adult Health

## 2023-09-24 ENCOUNTER — Encounter: Payer: Self-pay | Admitting: Adult Health

## 2023-09-24 ENCOUNTER — Other Ambulatory Visit (HOSPITAL_COMMUNITY)
Admission: RE | Admit: 2023-09-24 | Discharge: 2023-09-24 | Disposition: A | Discharge status: Home / Self Care | Payer: BC Managed Care – PPO | Source: Ambulatory Visit | Attending: Adult Health | Admitting: Adult Health

## 2023-09-24 VITALS — BP 110/73 | HR 60 | Ht 63.0 in | Wt 197.5 lb

## 2023-09-24 DIAGNOSIS — Z113 Encounter for screening for infections with a predominantly sexual mode of transmission: Secondary | ICD-10-CM | POA: Diagnosis not present

## 2023-09-24 DIAGNOSIS — Z3202 Encounter for pregnancy test, result negative: Secondary | ICD-10-CM | POA: Insufficient documentation

## 2023-09-24 DIAGNOSIS — Z124 Encounter for screening for malignant neoplasm of cervix: Secondary | ICD-10-CM | POA: Insufficient documentation

## 2023-09-24 DIAGNOSIS — N921 Excessive and frequent menstruation with irregular cycle: Secondary | ICD-10-CM

## 2023-09-24 LAB — POCT URINE PREGNANCY: Preg Test, Ur: NEGATIVE

## 2023-09-24 MED ORDER — NORETHIN ACE-ETH ESTRAD-FE 1-20 MG-MCG PO TABS: 1.0000 | ORAL_TABLET | Freq: Every day | ORAL | 11 refills | Status: DC

## 2023-09-24 NOTE — Progress Notes (Signed)
  Subjective:     Patient ID: Allison Pratt, female   DOB: [birth year], 38 y.o.   MRN: [id number]  HPI Allison Pratt is a 38 year old black female, married, [id number], in complaining of irregular periods that are heavy, lasting 4-5 days, with 2 days heavy will change pad every 2 hours and she requests STD testing, her husband has cheated. She needs a pap, too.   PCP is Dr Neita Carp  Review of Systems +irregular periods that are heavy, lasting 4-5 days, with 2 days heavy will change pad every 2 hours No pain with periods Denies any shortness of breath,dizziness,feeling tired, or problems with urination. BM or sex. She denies MI,stroke, DVT, breast cancer or migraine with aura.   Reviewed past medical,surgical, social and family history. Reviewed medications and allergies.  Objective:   Physical Exam BP 110/73 (BP Location: Left Arm, Patient Position: Sitting, Cuff Size: Normal)   Pulse 60   Ht 5\' 3"  (1.6 m)   Wt 197 lb 8 oz (89.6 kg)   LMP 09/19/2023   Breastfeeding No   BMI 34.99 kg/m  UPT is negative.   Skin warm and dry.  Lungs: clear to ausculation bilaterally. Cardiovascular: regular rate and rhythm.  Pelvic: external genitalia is normal in appearance no lesions, vagina:tan discharge,no odor,urethra has no lesions or masses noted, cervix:smooth, pap with GC/CHL and HR HPV genotyping performed, uterus: normal size, shape and contour, non tender, no masses felt, adnexa: no masses or tenderness noted. Bladder is non tender and no masses felt.  Fall risk is low  [facility] - 09/24/23 1409       Pregnancy Intention Screening   Does the patient want to become pregnant in the next year? No    Does the patient's partner want to become pregnant in the next year? No    Would the patient like to discuss contraceptive options today? No      Contraception Wrap Up   Current Method Female Sterilization    End Method Female Sterilization    Contraception Counseling Provided No             Examination chaperoned by Malachy Mood LPN, co exam with Toula Moos NP student Assessment:     1. Routine Papanicolaou smear Pap sent  - Cytology - PAP( [facility])  2. Pregnancy examination or test, negative result - POCT urine pregnancy  3. Menorrhagia with irregular cycle (Primary) +irregular periods that are heavy, lasting 4-5 days, with 2 days heavy will change pad every 2 hours Discussed options to regulate and she wants a pill Will rx junel 1-20, can start today Meds ordered this encounter  Medications   norethindrone-ethinyl estradiol-FE (LOESTRIN FE) 1-20 MG-MCG tablet    Sig: Take 1 tablet by mouth daily.    Dispense:  28 tablet    Refill:  11    Supervising Provider:   Duane Lope H [[id number]]   Follow up in 3 months for ROS and BP check   4. Screening examination for STD (sexually transmitted disease) Check GC/CHL on pap Check HIV and RPR - HIV Antibody (routine testing w rflx) - RPR     Plan:     Return in 3 months for ROS and BP check

## 2023-09-25 LAB — RPR: RPR Ser Ql: NONREACTIVE

## 2023-09-25 LAB — HIV ANTIBODY (ROUTINE TESTING W REFLEX): HIV Screen 4th Generation wRfx: NONREACTIVE

## 2023-09-30 LAB — CYTOLOGY - PAP
Chlamydia: NEGATIVE
Comment: NEGATIVE
Comment: NEGATIVE
Comment: NORMAL
Diagnosis: NEGATIVE
High risk HPV: NEGATIVE
Neisseria Gonorrhea: NEGATIVE

## 2023-10-14 ENCOUNTER — Encounter (HOSPITAL_COMMUNITY): Payer: Self-pay

## 2023-10-14 ENCOUNTER — Ambulatory Visit (HOSPITAL_COMMUNITY)
Admission: EM | Admit: 2023-10-14 | Discharge: 2023-10-14 | Disposition: A | Discharge status: Home / Self Care | Payer: BC Managed Care – PPO | Attending: Family Medicine | Admitting: Family Medicine

## 2023-10-14 DIAGNOSIS — S86112A Strain of other muscle(s) and tendon(s) of posterior muscle group at lower leg level, left leg, initial encounter: Secondary | ICD-10-CM | POA: Diagnosis not present

## 2023-10-14 DIAGNOSIS — M79662 Pain in left lower leg: Secondary | ICD-10-CM

## 2023-10-14 MED ORDER — IBUPROFEN 800 MG PO TABS
800.0000 mg | ORAL_TABLET | Freq: Once | ORAL | Status: AC
Start: 2023-10-14 — End: 2023-10-14
  Administered 2023-10-14: 800 mg via ORAL

## 2023-10-14 MED ORDER — IBUPROFEN 800 MG PO TABS
ORAL_TABLET | ORAL | Status: AC
  Filled 2023-10-14: qty 1

## 2023-10-14 MED ORDER — NAPROXEN 500 MG PO TABS
500.0000 mg | ORAL_TABLET | Freq: Two times a day (BID) | ORAL | 0 refills | Status: DC
Start: 2023-10-14 — End: 2023-10-30

## 2023-10-14 NOTE — ED Provider Notes (Addendum)
 [facility]    CSN: [id number] Arrival date & time: 10/14/23  1055      History   Chief Complaint Chief Complaint  Patient presents with   Leg Pain   Claudication    HPI Allison Pratt is a 38 y.o. female.    Leg Pain  Patient is here for left lower leg/calf pain.  This started 4 days ago after being chased by a dog, and this morning she heard and felt a pop to the calf when getting out of the car.  Pain worsened after that.  Pain is mostly with movement, walking.  No otc meds used.           Past Medical History:  Diagnosis Date   Infection of hand 04/04/2012    Patient Active Problem List   Diagnosis Date Noted   Menorrhagia with irregular cycle 09/24/2023   Pregnancy examination or test, negative result 09/24/2023   Routine Papanicolaou smear 09/24/2023   Screening examination for STD (sexually transmitted disease) 09/24/2023   Acute blood loss anemia 09/21/2020   History of bilateral tubal ligation 09/21/2020   H/O cesarean section 09/20/2020   Cesarean delivery delivered 09/20/2020   History of ectopic pregnancy 02/02/2020    Past Surgical History:  Procedure Laterality Date   CESAREAN SECTION WITH BILATERAL TUBAL LIGATION Bilateral 09/20/2020   Procedure: CESAREAN SECTION WITH BILATERAL TUBAL LIGATION;  Surgeon: Venora Maples, MD;  Location: [facility];  Service: Obstetrics;  Laterality: Bilateral;   DILATION AND CURETTAGE OF UTERUS     ECTOPIC PREGNANCY SURGERY Left    I & D EXTREMITY  04/03/2012   Procedure: IRRIGATION AND DEBRIDEMENT EXTREMITY;  Surgeon: Marlowe Shores, MD;  Location: [facility];  Service: Orthopedics;  Laterality: Left;    OB History     Gravida  4   Para  2   Term  2   Preterm      AB  2   Living  2      SAB  1   IAB      Ectopic  1   Multiple      Live Births  2            Home Medications    Prior to Admission medications   Medication Sig Start Date End Date Taking? Authorizing  Provider  acetaminophen (TYLENOL) 500 MG tablet Take 2 tablets (1,000 mg total) by mouth every 6 (six) hours as needed for mild pain or moderate pain. 09/22/20   Gita Kudo, MD  Blood Pressure Monitor MISC For regular home bp monitoring during pregnancy 03/29/20   Arabella Merles, CNM  ibuprofen (ADVIL) 600 MG tablet Take 1 tablet (600 mg total) by mouth every 6 (six) hours. 09/22/20   Gita Kudo, MD  norethindrone-ethinyl estradiol-FE (LOESTRIN FE) 1-20 MG-MCG tablet Take 1 tablet by mouth daily. 09/24/23 09/23/24  Adline Potter, NP    Family History Family History  Problem Relation Age of Onset   Diabetes Maternal Grandmother    Diabetes Maternal Grandfather     Social History Social History   Tobacco Use   Smoking status: Former    Current packs/day: 0.25    Types: Cigarettes   Smokeless tobacco: Never  Vaping Use   Vaping status: Never Used  Substance Use Topics   Alcohol use: No   Drug use: No     Allergies   Patient has no known allergies.   Review of Systems  Review of Systems  Constitutional: Negative.   HENT: Negative.    Respiratory: Negative.    Cardiovascular: Negative.   Gastrointestinal: Negative.   Musculoskeletal:  Positive for myalgias.     Physical Exam Triage Vital Signs ED Triage Vitals  Encounter Vitals Group     BP 10/14/23 1210 (!) 99/57     Systolic BP Percentile --      Diastolic BP Percentile --      Pulse Rate 10/14/23 1210 66     Resp 10/14/23 1210 18     Temp 10/14/23 1210 98.2 F (36.8 C)     Temp Source 10/14/23 1210 Oral     SpO2 10/14/23 1210 97 %     Weight --      Height --      Head Circumference --      Peak Flow --      Pain Score 10/14/23 1208 8     Pain Loc --      Pain Education --      Exclude from Growth Chart --    No data found.  Updated Vital Signs BP (!) 99/57 (BP Location: Left Arm)   Pulse 66   Temp 98.2 F (36.8 C) (Oral)   Resp 18   LMP 09/19/2023   SpO2 97%   Visual  Acuity Right Eye Distance:   Left Eye Distance:   Bilateral Distance:    Right Eye Near:   Left Eye Near:    Bilateral Near:     Physical Exam Constitutional:      Appearance: She is normal weight.  Musculoskeletal:     Comments: No obvious swelling to the left LE;  She has TTP to the medial aspect of the calf;  pain with dorsiflexion of the foot/ankle  Neurological:     General: No focal deficit present.     Mental Status: She is alert.  Psychiatric:        Mood and Affect: Mood normal.      UC Treatments / Results  Labs (all labs ordered are listed, but only abnormal results are displayed) Labs Reviewed - No data to display  EKG   Radiology No results found.  Procedures Procedures (including critical care time)  Medications Ordered in UC Medications  ibuprofen (ADVIL) tablet 800 mg (800 mg Oral Given 10/14/23 1241)    Initial Impression / Assessment and Plan / UC Course  I have reviewed the triage vital signs and the nursing notes.  Pertinent labs & imaging results that were available during my care of the patient were reviewed by me and considered in my medical decision making (see chart for details).   Final Clinical Impressions(s) / UC Diagnoses   Final diagnoses:  Pain of left calf  Strain of gastrocnemius muscle of left lower extremity, initial encounter     Discharge Instructions      You were seen today for a strain of the calf muscle.  I have given you motrin today, and sent a script to the pharmacy as well.  You should rest the leg as much as possible.  We have given you a boot to wear to help.  If you continue with pain, then please follow up with the orthopedist [facility] at [phone].     ED Prescriptions     Medication Sig Dispense Auth. Provider   naproxen (NAPROSYN) 500 MG tablet Take 1 tablet (500 mg total) by mouth 2 (two) times daily. 30 tablet Jannifer Franklin, MD  PDMP not reviewed this encounter.   Jannifer Franklin,  MD 10/14/23 1245    Jannifer Franklin, MD 10/14/23 1254

## 2023-10-14 NOTE — Discharge Instructions (Addendum)
You were seen today for a strain of the calf muscle.  I have given you motrin today, and sent a script to the pharmacy as well.  You should rest the leg as much as possible.  We have given you a boot to wear to help.  If you continue with pain, then please follow up with the orthopedist [facility] at [phone].

## 2023-10-14 NOTE — ED Triage Notes (Signed)
Pt c/o of left calf pain. On Friday she was chased by dog and started having left calf soreness. This morning while getting her daughter out of the car she had felt a popping sensation that was painful in her left. The pain increases with walking and decreases with rest.   Interventions: None

## 2023-10-30 ENCOUNTER — Ambulatory Visit: Payer: BC Managed Care – PPO | Admitting: Adult Health

## 2023-10-30 ENCOUNTER — Encounter: Payer: Self-pay | Admitting: Adult Health

## 2023-10-30 VITALS — BP 113/73 | HR 70 | Ht 63.0 in | Wt 198.0 lb

## 2023-10-30 DIAGNOSIS — N921 Excessive and frequent menstruation with irregular cycle: Secondary | ICD-10-CM | POA: Diagnosis not present

## 2023-10-30 MED ORDER — MEGESTROL ACETATE 40 MG PO TABS
ORAL_TABLET | ORAL | 1 refills | Status: DC
Start: 2023-10-30 — End: 2024-02-25

## 2023-10-30 NOTE — Progress Notes (Signed)
  Subjective:     Patient ID: Allison Pratt, female   DOB: [birth year], 38 y.o.   MRN: [id number]  HPI Allison Pratt is a 38 year old black female, married, [id number], in complaining of irregular bleeding, stopped BCP. Has been bleeding since 10/17/23.     Component Value Date/Time   DIAGPAP  09/24/2023 1410    - Negative for intraepithelial lesion or malignancy (NILM)   DIAGPAP  03/29/2020 0950    - Negative for intraepithelial lesion or malignancy (NILM)   HPVHIGH Negative 09/24/2023 1410   HPVHIGH Negative 03/29/2020 0950   ADEQPAP  09/24/2023 1410    Satisfactory for evaluation; transformation zone component PRESENT.   ADEQPAP  03/29/2020 0950    Satisfactory for evaluation; transformation zone component ABSENT.    PCP is Dr Neita Carp  Review of Systems +irregular bleeding, stopped BCP. Has been bleeding since 10/17/23. Reviewed past medical,surgical, social and family history. Reviewed medications and allergies.     Objective:   Physical Exam BP 113/73 (BP Location: Left Arm, Patient Position: Sitting, Cuff Size: Normal)   Pulse 70   Ht 5\' 3"  (1.6 m)   Wt 198 lb (89.8 kg)   LMP 10/17/2023 (Exact Date)   BMI 35.07 kg/m     Skin warm and dry.  Lungs: clear to ausculation bilaterally. Cardiovascular: regular rate and rhythm.   Up[facility] - 10/30/23 1523       Pregnancy Intention Screening   Does the patient want to become pregnant in the next year? No    Does the patient's partner want to become pregnant in the next year? No    Would the patient like to discuss contraceptive options today? No      Contraception Wrap Up   Current Method Female Sterilization    End Method Female Sterilization    Contraception Counseling Provided No             Assessment:     1. Menorrhagia with irregular cycle (Primary) Has been bleeding since 10/17/23 Will rx megace to stop bleeding Meds ordered this encounter  Medications   megestrol (MEGACE) 40 MG tablet    Sig: Take 3 tablets x 5  days then 2 tabs for 5 days then 1 daily    Dispense:  45 tablet    Refill:  1    Supervising Provider:   Lazaro Arms [[id number]]       Plan:      Follow up with me in 4 weeks for ROS

## 2023-12-23 ENCOUNTER — Ambulatory Visit: Payer: BC Managed Care – PPO | Admitting: Adult Health

## 2023-12-29 ENCOUNTER — Ambulatory Visit: Admitting: Adult Health

## 2024-02-25 ENCOUNTER — Ambulatory Visit (INDEPENDENT_AMBULATORY_CARE_PROVIDER_SITE_OTHER): Admitting: Adult Health

## 2024-02-25 ENCOUNTER — Encounter: Payer: Self-pay | Admitting: Adult Health

## 2024-02-25 VITALS — BP 122/85 | HR 73 | Ht 63.0 in | Wt 200.0 lb

## 2024-02-25 DIAGNOSIS — N938 Other specified abnormal uterine and vaginal bleeding: Secondary | ICD-10-CM | POA: Insufficient documentation

## 2024-02-25 DIAGNOSIS — Z3202 Encounter for pregnancy test, result negative: Secondary | ICD-10-CM | POA: Diagnosis not present

## 2024-02-25 LAB — POCT URINE PREGNANCY: Preg Test, Ur: NEGATIVE

## 2024-02-25 MED ORDER — NORETHINDRONE ACETATE 5 MG PO TABS: 5.0000 mg | ORAL_TABLET | Freq: Every day | ORAL | 3 refills | Status: DC

## 2024-02-25 NOTE — Progress Notes (Signed)
  Subjective:     Patient ID: Allison Pratt, female   DOB: [birth year], 38 y.o.   MRN: [id number]  HPI Rya is a 38 year old black female, married, [id number] in complaining of bleeding since 02/03/24 will be red then brown then red, and has had some night sweats.     Component Value Date/Time   DIAGPAP  09/24/2023 1410    - Negative for intraepithelial lesion or malignancy (NILM)   DIAGPAP  03/29/2020 0950    - Negative for intraepithelial lesion or malignancy (NILM)   HPVHIGH Negative 09/24/2023 1410   HPVHIGH Negative 03/29/2020 0950   ADEQPAP  09/24/2023 1410    Satisfactory for evaluation; transformation zone component PRESENT.   ADEQPAP  03/29/2020 0950    Satisfactory for evaluation; transformation zone component ABSENT.   PCP is Dr Atilano  Review of Systems Bleeding since 02/03/24 +night sweats Denies any pain, SHOB or dizziness  Bleeding is not heavy   Reviewed past medical,surgical, social and family history. Reviewed medications and allergies.  Objective:   Physical Exam BP 122/85 (BP Location: Right Arm, Patient Position: Sitting, Cuff Size: Large)   Pulse 73   Ht 5' 3 (1.6 m)   Wt 200 lb (90.7 kg)   LMP 02/03/2024   BMI 35.43 kg/m  UPT is negative Skin warm and dry.  Lungs: clear to ausculation bilaterally. Cardiovascular: regular rate and rhythm.    She declines exam.   Upstream - 02/25/24 1402       Pregnancy Intention Screening   Does the patient want to become pregnant in the next year? No    Does the patient's partner want to become pregnant in the next year? No    Would the patient like to discuss contraceptive options today? No      Contraception Wrap Up   Current Method Female Sterilization    End Method Female Sterilization    Contraception Counseling Provided No          Assessment:     1. Pregnancy examination or test, negative result  - POCT urine pregnancy  2. DUB (dysfunctional uterine bleeding) (Primary) Bleeding red/brown since  02/03/24 Will rx aygestin 5 mg 1 daily  Meds ordered this encounter  Medications   norethindrone (AYGESTIN) 5 MG tablet    Sig: Take 1 tablet (5 mg total) by mouth daily.    Dispense:  30 tablet    Refill:  3    Supervising Provider:   JAYNE VONN DEL [[id number]]       Plan:     Follow up in 3 months for ROS

## 2024-03-18 ENCOUNTER — Other Ambulatory Visit: Payer: Self-pay | Admitting: Adult Health

## 2024-03-18 MED ORDER — MEGESTROL ACETATE 40 MG/ML PO SUSP
ORAL | 1 refills | Status: DC
Start: 2024-03-18 — End: 2024-07-08

## 2024-03-18 NOTE — Progress Notes (Signed)
Rx megace??  

## 2024-05-27 ENCOUNTER — Ambulatory Visit: Admitting: Adult Health

## 2024-07-08 ENCOUNTER — Encounter

## 2024-07-08 ENCOUNTER — Telehealth: Admitting: Physician Assistant

## 2024-07-08 DIAGNOSIS — N938 Other specified abnormal uterine and vaginal bleeding: Secondary | ICD-10-CM | POA: Diagnosis not present

## 2024-07-08 MED ORDER — MEGESTROL ACETATE 40 MG/ML PO SUSP: ORAL | 0 refills | Status: AC

## 2024-07-08 NOTE — Patient Instructions (Signed)
 Tyronza L Epstein, thank you for joining Delon CHRISTELLA Dickinson, PA-C for today's virtual visit.  While this provider is not your primary care provider (PCP), if your PCP is located in our provider database this encounter information will be shared with them immediately following your visit.   A [facility] MyChart account gives you access to today's visit and all your visits, tests, and labs performed at [facility]  click here if you don't have a [facility] MyChart account or go to [email or web address]  Consent: (Patient) Allison Pratt provided verbal consent for this virtual visit at the beginning of the encounter.  Current Medications:  Current Outpatient Medications:    acetaminophen  (TYLENOL ) 500 MG tablet, Take 2 tablets (1,000 mg total) by mouth every 6 (six) hours as needed for mild pain or moderate pain., Disp: 30 tablet, Rfl: 0   Blood Pressure Monitor MISC, For regular home bp monitoring during pregnancy, Disp: 1 each, Rfl: 0   megestrol  (MEGACE ) 40 MG/ML suspension, Take 3 ml x 5 days then 2 ml x 5 days then 1 ml daily till bleeding stops, Disp: 120 mL, Rfl: 0   Medications ordered in this encounter:  Meds ordered this encounter  Medications   megestrol  (MEGACE ) 40 MG/ML suspension    Sig: Take 3 ml x 5 days then 2 ml x 5 days then 1 ml daily till bleeding stops    Dispense:  120 mL    Refill:  0    Supervising Provider:   BLAISE ALEENE KIDD [[id number]]     *If you need refills on other medications prior to your next appointment, please contact your pharmacy*  Follow-Up: Call back or seek an in-person evaluation if the symptoms worsen or if the condition fails to improve as anticipated.  [facility] [phone]  Other Instructions  Heavy or Long-Lasting Menstrual Periods: What to Know Heavy or long-lasting periods are also called abnormal uterine bleeding. This is when your monthly periods are heavy or last longer than normal.  If you  have this condition, bleeding and cramping may get so bad that they make it hard for you to do your daily activities. What are the causes? Common causes of this condition include: Growths in the uterus (polyps or fibroids). These growths are not cancer. Problems with the tissue that lines the uterus. This tissue may: Grow into the walls of the uterus (adenomyosis). Grow outside the uterus (endometriosis). One of the ovaries not releasing an egg during one or more months. A disorder that stops the blood from clotting normally. Some medicines. Infection. In some cases, the cause of this condition is not known. What increases the risk? You are more likely to get this condition if you have cancer of the uterus. What are the signs or symptoms? Symptoms of this condition include: Heavy bleeding. You may bleed so much that you have: To change your pad or tampon every 1-2 hours. To use pads and tampons at the same time. To wake up to change your pads or tampons during the night. Blood clots that are larger than 1 inch (2.5 cm) in size. Bleeding that lasts for more than 7 days. How is this diagnosed? This condition may be diagnosed based on a physical exam and your symptoms. Your health care provider will ask you about your period. You may also have tests, including: Blood tests. Pap test. Biopsy. A small piece of the tissue that lines the uterus is tested. Ultrasound of the uterus, ovaries,  and vagina. Looking inside the uterus using a flexible tube with a light (hysteroscope). How is this treated? You may not need treatment for this condition. But if you need treatment, you may be given: Birth control pills or an IUD. Hormone therapy. Medicine to reduce swelling, such as ibuprofen . Medicine to make growths in the uterus smaller. Medicines to make your blood clot. Iron  pills. Antibiotics. These are given if your bleeding is caused by an infection. If medicines do not work, surgery may  be done. Surgery may be done to: Remove a part of the lining of the uterus. Remove growths in the uterus. These may be polyps or fibroids. Remove the entire lining of the uterus. Remove the uterus entirely. Talk with your provider about your options for treatment. Some treatments may make it hard for you to get pregnant. Tell your provider if you'd like to get pregnant after treatment. Follow these instructions at home: Medicines Take your medicines only as told. Do not change or switch medicines without asking your provider. Do not take aspirin or medicines that have aspirin 1 week before your period, or during your period. Aspirin may make bleeding worse. Managing trouble pooping Your iron  pills may cause trouble pooping (constipation). To help prevent or treat this, you may need to: Take medicines to help you poop. Eat foods high in fiber, like beans, whole grains, and fresh fruits and vegetables. Drink more fluids as told. General instructions If you need to change your pad or tampon more than once every 2 hours, limit your activity until the bleeding stops. Eat well-balanced meals, including foods that are high in iron . Foods that have a lot of iron  include leafy, green vegetables, meat, liver, eggs, and whole-grain breads and cereals. Do not try to lose weight until the heavy bleeding has stopped and your blood iron  level is back to normal. If you need to lose weight, work with your provider to lose weight safely. Keep all follow-up visits. Your provider will make sure your treatment is working. Your provider may adjust your treatment plan if it is not working. Contact a health care provider if: You soak through a pad or tampon every 1 or 2 hours, and this happens every time you have a period. You need to use pads and tampons at the same time because you are bleeding so much. You vomit or feel like you vomiting. You have watery poop (diarrhea). You have problems from the medicines  you are taking. You feel very weak or tired. You feel dizzy or you faint. Get help right away if: You soak through more than a pad or tampon in 1 hour. You pass clots bigger than 1 inch (2.5 cm) wide. You feel short of breath. You feel like your heart is beating too fast. These symptoms may be an emergency. Call 911 right away. Do not wait to see if the symptoms will go away. Do not drive yourself to the hospital. This information is not intended to replace advice given to you by your health care provider. Make sure you discuss any questions you have with your health care provider. Document Revised: 07/01/2023 Document Reviewed: 02/11/2023 Elsevier Patient Education  2024 [employer]   If you have been instructed to have an in-person evaluation today at a local Urgent Care facility, please use the link below. It will take you to a list of all of our available [facility], including address, phone number and hours of operation. Please do not delay  care.  [facility]  If you or a family member do not have a primary care provider, use the link below to schedule a visit and establish care. When you choose a [facility] primary care physician or advanced practice provider, you gain a long-term partner in health. Find a Primary Care Provider  Learn more about [facility]'s in-office and virtual care options: [facility] - Get Care Now

## 2024-07-08 NOTE — Progress Notes (Signed)
 Virtual Visit Consent   Allison Pratt, you are scheduled for a virtual visit with a [facility] provider today. Just as with appointments in the office, your consent must be obtained to participate. Your consent will be active for this visit and any virtual visit you may have with one of our providers in the next 365 days. If you have a MyChart account, a copy of this consent can be sent to you electronically.  As this is a virtual visit, video technology does not allow for your provider to perform a traditional examination. This may limit your provider's ability to fully assess your condition. If your provider identifies any concerns that need to be evaluated in person or the need to arrange testing (such as labs, EKG, etc.), we will make arrangements to do so. Although advances in technology are sophisticated, we cannot ensure that it will always work on either your end or our end. If the connection with a video visit is poor, the visit may have to be switched to a telephone visit. With either a video or telephone visit, we are not always able to ensure that we have a secure connection.  By engaging in this virtual visit, you consent to the provision of healthcare and authorize for your insurance to be billed (if applicable) for the services provided during this visit. Depending on your insurance coverage, you may receive a charge related to this service.  I need to obtain your verbal consent now. Are you willing to proceed with your visit today? Maiya L Gatt has provided verbal consent on 07/08/2024 for a virtual visit (video or telephone). Delon CHRISTELLA Dickinson, PA-C  Date: 07/08/2024 10:27 AM   Virtual Visit via Video Note   I, Delon CHRISTELLA Dickinson, connected with  Allison Pratt  ([id number], 03/03/1986) on 07/08/24 at 10:15 AM EST by a video-enabled telemedicine application and verified that I am speaking with the correct person using two identifiers.  Location: Patient: Virtual Visit  Location Patient: Home Provider: Virtual Visit Location Provider: Home Office   I discussed the limitations of evaluation and management by telemedicine and the availability of in person appointments. The patient expressed understanding and agreed to proceed.    History of Present Illness: Allison Pratt is a 38 y.o. who identifies as a female who was assigned female at birth, and is being seen today for abnormal uterine bleeding. Long history of this. Last seen by OB/GYN Allison Lewis, NP) on 02/25/24. Previously, has used Megace  to stop abnormal bleeding. This seems to work well.   Current menstrual started on 06/17/24 and has not stopped yet. Denies abnormally heavy bleeding. No signs of anemia at this time, has had this happen in the past.   Reports work schedule is not suitable to be seen in person at this time.    Problems:  Patient Active Problem List   Diagnosis Date Noted   DUB (dysfunctional uterine bleeding) 02/25/2024   Menorrhagia with irregular cycle 09/24/2023   Pregnancy examination or test, negative result 09/24/2023   Routine Papanicolaou smear 09/24/2023   Screening examination for STD (sexually transmitted disease) 09/24/2023   Acute blood loss anemia 09/21/2020   History of bilateral tubal ligation 09/21/2020   H/O cesarean section 09/20/2020   Cesarean delivery delivered 09/20/2020   History of ectopic pregnancy 02/02/2020    Allergies: No Known Allergies Medications:  Current Outpatient Medications:    acetaminophen  (TYLENOL ) 500 MG tablet, Take 2 tablets (1,000 mg total) by mouth every 6 (  six) hours as needed for mild pain or moderate pain., Disp: 30 tablet, Rfl: 0   Blood Pressure Monitor MISC, For regular home bp monitoring during pregnancy, Disp: 1 each, Rfl: 0   megestrol  (MEGACE ) 40 MG/ML suspension, Take 3 ml x 5 days then 2 ml x 5 days then 1 ml daily till bleeding stops, Disp: 120 mL, Rfl: 0  Observations/Objective: Patient is well-developed,  well-nourished in no acute distress.  Resting comfortably at home.  Head is normocephalic, atraumatic.  No labored breathing.  Speech is clear and coherent with logical content.  Patient is alert and oriented at baseline.    Assessment and Plan: 1. DUB (dysfunctional uterine bleeding) (Primary) - megestrol  (MEGACE ) 40 MG/ML suspension; Take 3 ml x 5 days then 2 ml x 5 days then 1 ml daily till bleeding stops  Dispense: 120 mL; Refill: 0  - Recurrent issue - Megace  refilled - Advised we do not normally treat with hormonal options, but since she has been followed regularly and not overusing the medication that I would refill this once - Schedule a follow up with OB/GYN in near future for further evaluation  Follow Up Instructions: I discussed the assessment and treatment plan with the patient. The patient was provided an opportunity to ask questions and all were answered. The patient agreed with the plan and demonstrated an understanding of the instructions.  A copy of instructions were sent to the patient via MyChart unless otherwise noted below.    The patient was advised to call back or seek an in-person evaluation if the symptoms worsen or if the condition fails to improve as anticipated.    Delon CHRISTELLA Dickinson, PA-C

## 2024-07-09 ENCOUNTER — Other Ambulatory Visit: Payer: Self-pay | Admitting: Adult Health
